# Patient Record
Sex: Female | Born: 1990 | Race: White | Hispanic: No | Smoking: Never smoker
Health system: Southern US, Community
[De-identification: ages and names within clinical notes are randomized; demographics above are authoritative.]

## PROBLEM LIST (undated history)

## (undated) DIAGNOSIS — A749 Chlamydial infection, unspecified: Secondary | ICD-10-CM

## (undated) DIAGNOSIS — K529 Noninfective gastroenteritis and colitis, unspecified: Secondary | ICD-10-CM

## (undated) DIAGNOSIS — O24419 Gestational diabetes mellitus in pregnancy, unspecified control: Secondary | ICD-10-CM

## (undated) DIAGNOSIS — R87629 Unspecified abnormal cytological findings in specimens from vagina: Secondary | ICD-10-CM

## (undated) DIAGNOSIS — R197 Diarrhea, unspecified: Secondary | ICD-10-CM

## (undated) DIAGNOSIS — IMO0002 Reserved for concepts with insufficient information to code with codable children: Secondary | ICD-10-CM

## (undated) DIAGNOSIS — Z87442 Personal history of urinary calculi: Secondary | ICD-10-CM

## (undated) DIAGNOSIS — I209 Angina pectoris, unspecified: Secondary | ICD-10-CM

## (undated) HISTORY — DX: Noninfective gastroenteritis and colitis, unspecified: K52.9

## (undated) HISTORY — DX: Unspecified abnormal cytological findings in specimens from vagina: R87.629

## (undated) HISTORY — DX: Reserved for concepts with insufficient information to code with codable children: IMO0002

## (undated) HISTORY — DX: Personal history of urinary calculi: Z87.442

## (undated) HISTORY — DX: Diarrhea, unspecified: R19.7

## (undated) HISTORY — DX: Chlamydial infection, unspecified: A74.9

---
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
## 2010-04-12 ENCOUNTER — Ambulatory Visit: Payer: Self-pay | Admitting: Gynecology

## 2010-08-04 IMAGING — US US PELV - US TRANSVAGINAL
1 series · 17 of 25 positions shown · non-contrast
Comparison: none

REASON FOR EXAM: right lower lower quadrant pain nausea, vomiting
COMMENTS:   LMP: Two weeks ago

[Series 1: us pelv - us transvaginal · 17 of 53 slices shown]
[im 1/53]
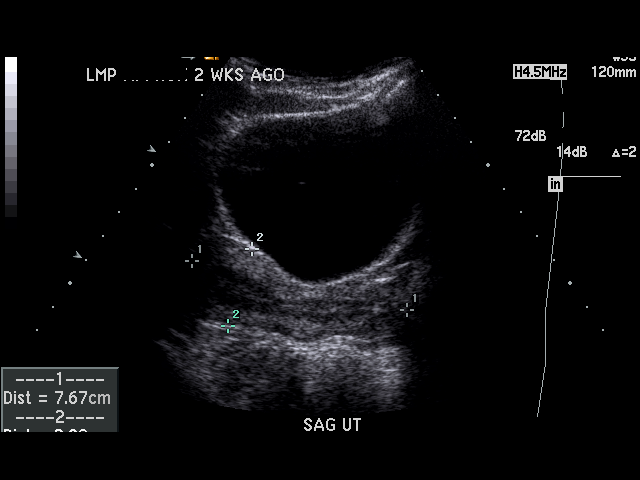
[im 5/53]
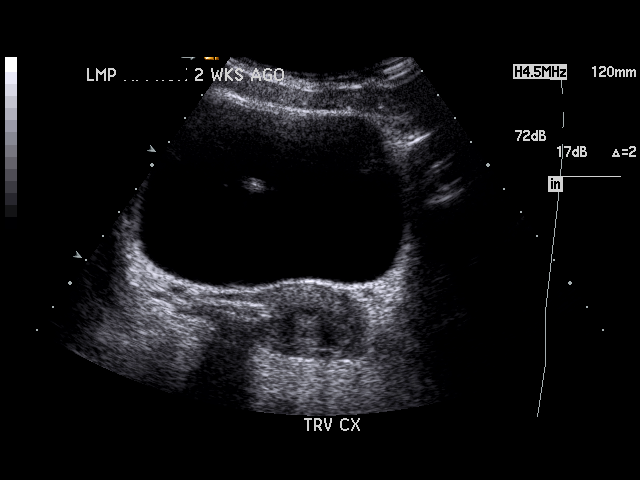
[im 7/53]
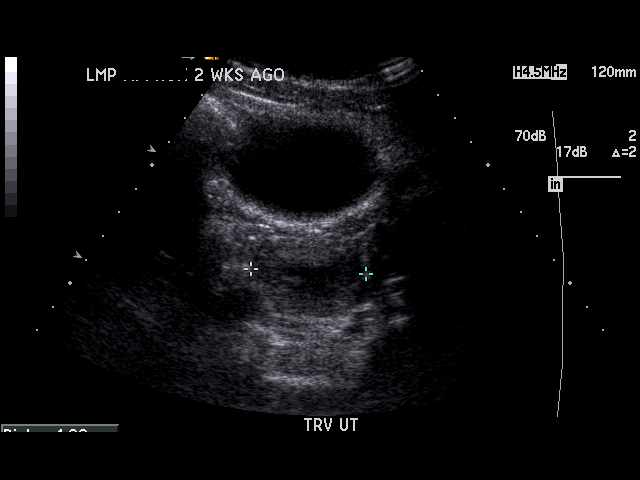
[im 11/53]
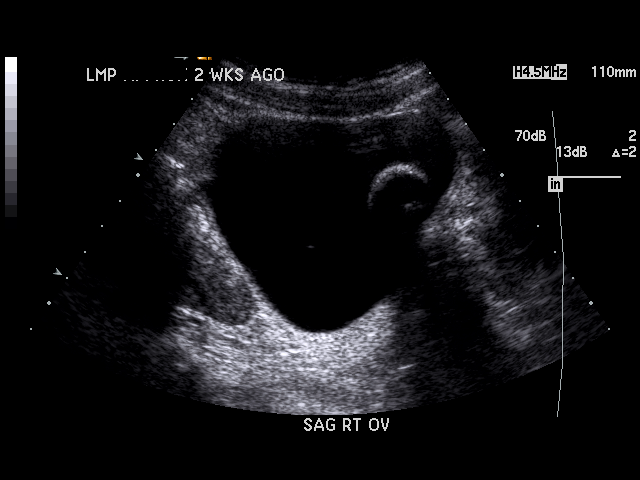
[im 14/53]
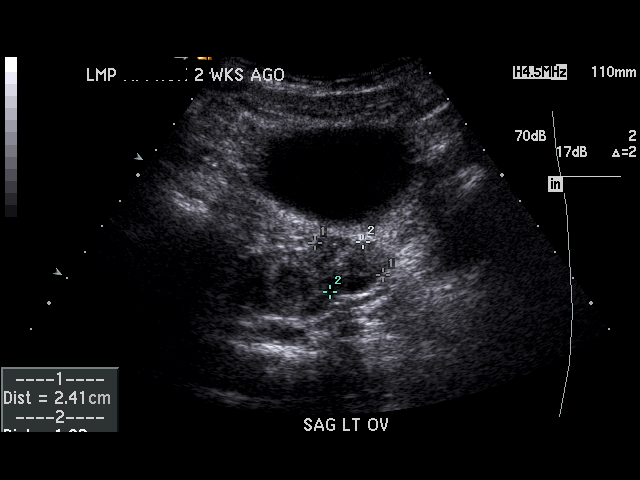
[im 18/53]
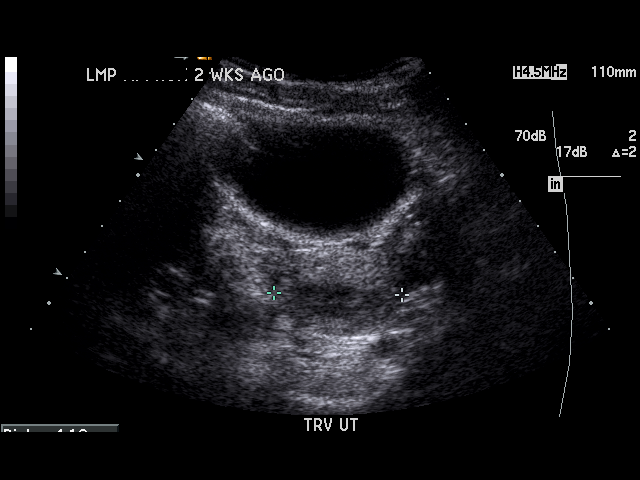
[im 20/53]
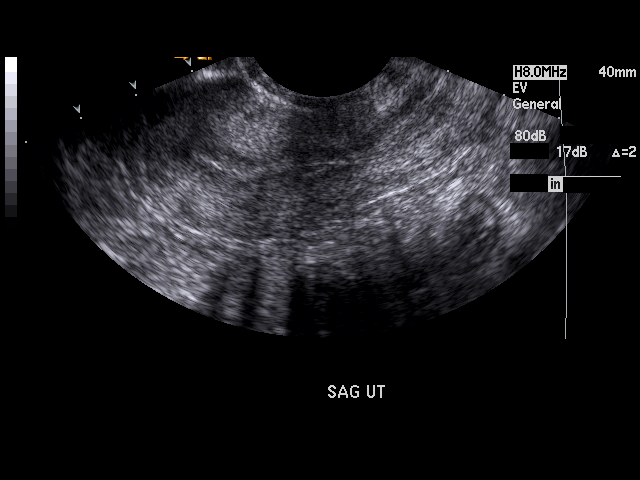
[im 24/53]
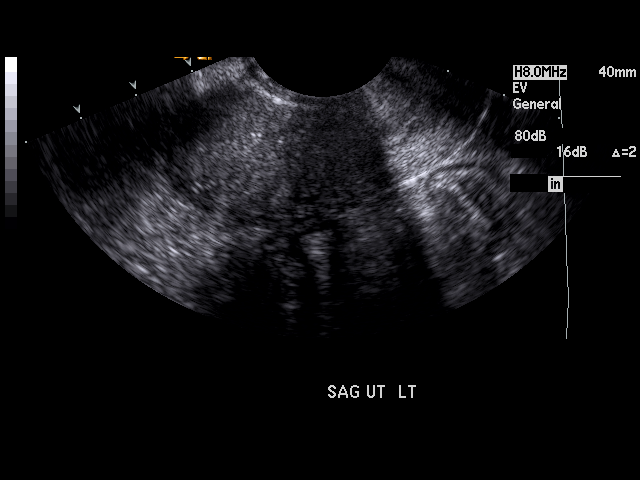
[im 27/53]
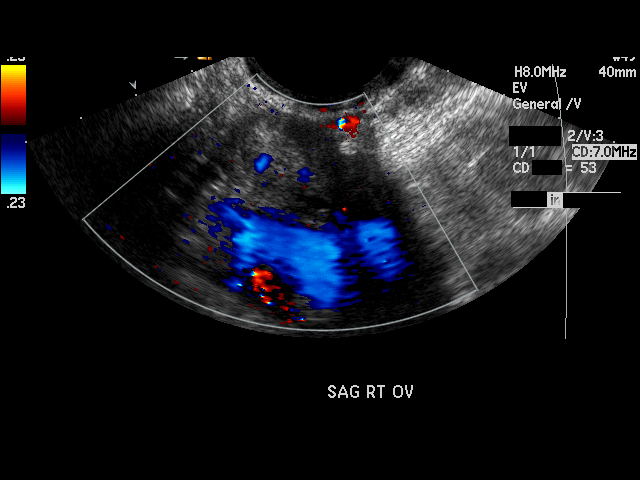
[im 29/53]
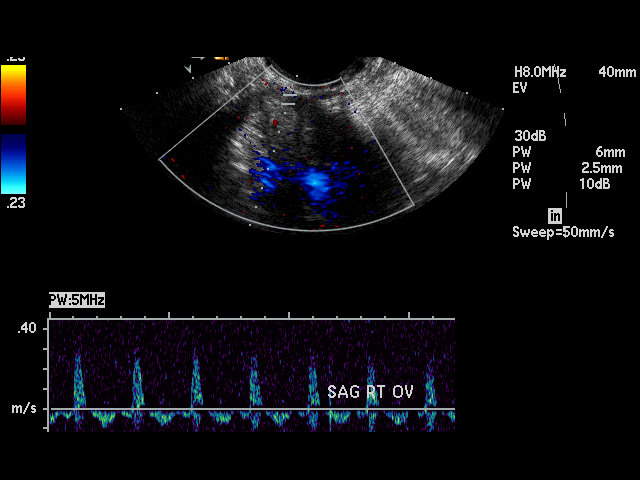
[im 33/53]
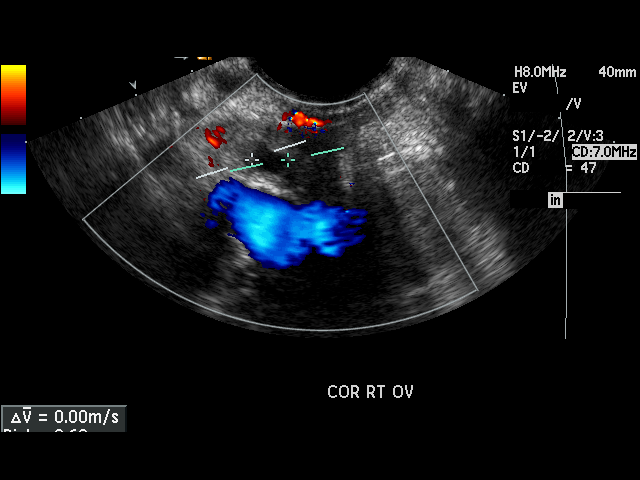
[im 35/53]
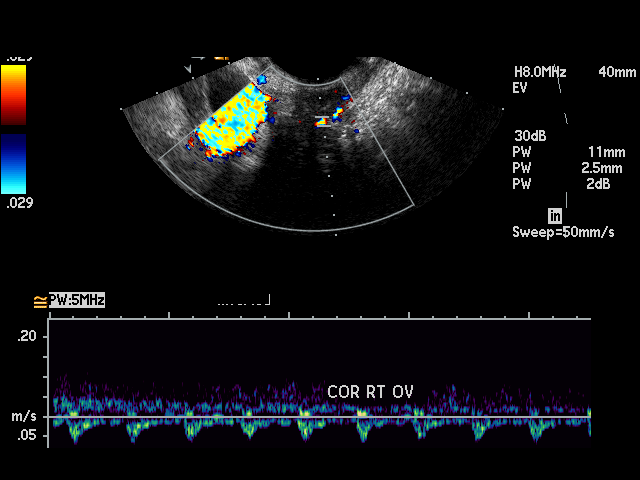
[im 40/53]
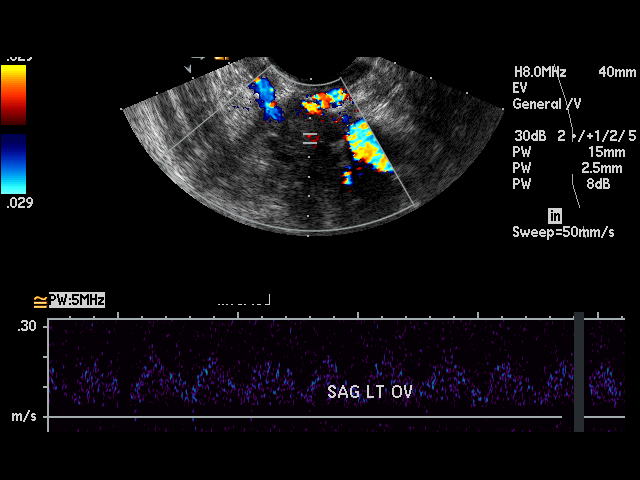
[im 42/53]
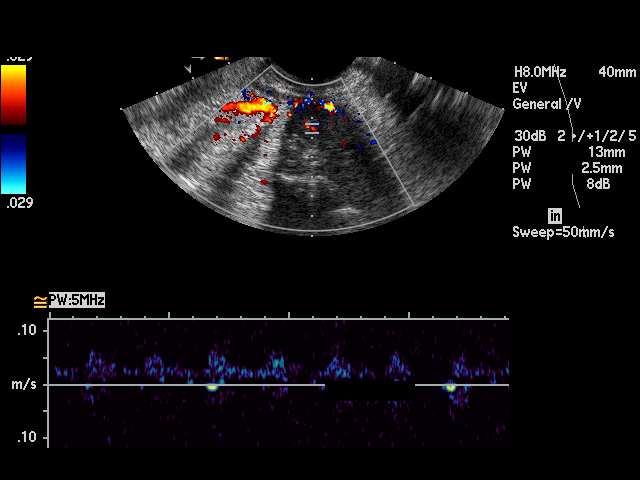
[im 46/53]
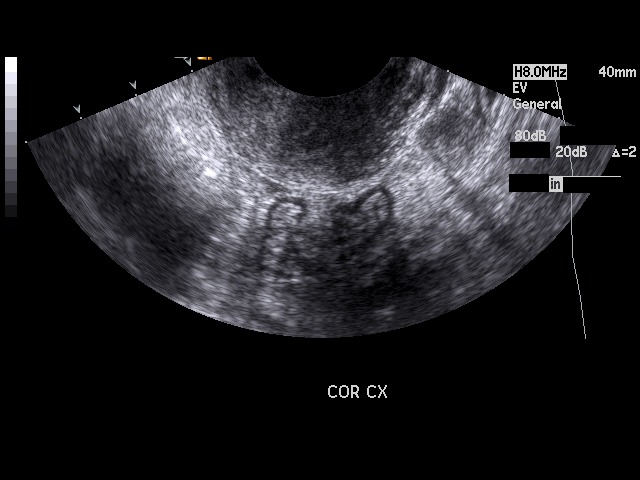
[im 48/53]
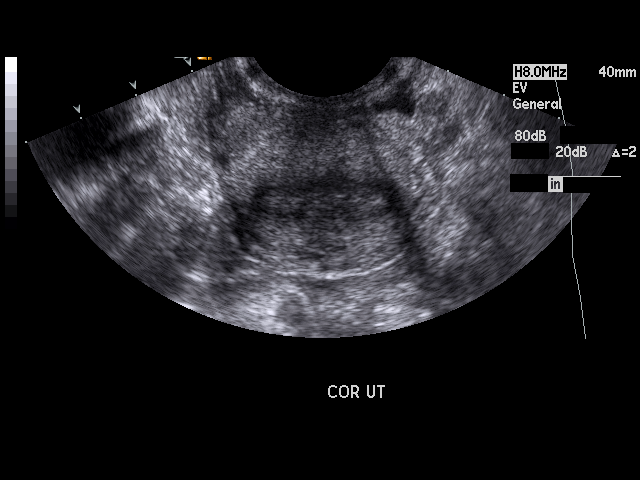
[im 53/53]
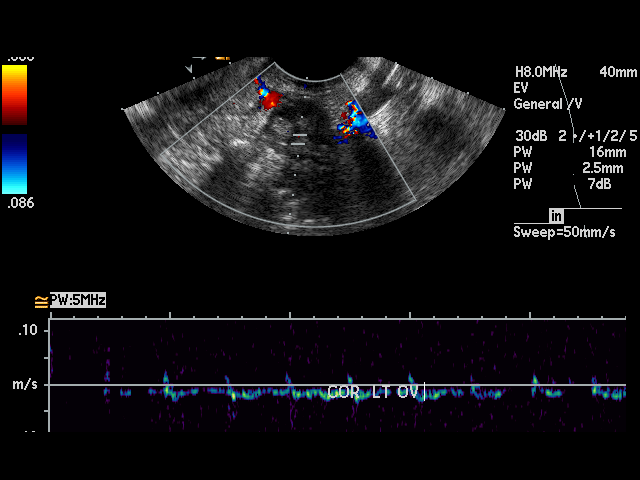

[17 of 25 positions shown; findings below may reference images not displayed]

PROCEDURE:     US  - US PELVIS EXAM W/TRANSVAGINAL  - February 12, 2009  [DATE]

RESULT:     Transabdominal and endovaginal scanning of the pelvis shows the
uterus measures 7.7 x 2.8 x 4.0 cm. The endometrial stripe thickness is 3
mm. The ovaries are normal in appearance with no mass or cyst. Normal blood
flow is seen to both ovaries. There is no free fluid or abnormal fluid
collection.
IMPRESSION: 1. Normal appearing pelvic sonogram.

## 2011-02-25 ENCOUNTER — Encounter: Payer: Self-pay | Admitting: *Deleted

## 2011-04-20 ENCOUNTER — Other Ambulatory Visit: Payer: Self-pay | Admitting: Gynecology

## 2011-04-24 ENCOUNTER — Encounter: Payer: Self-pay | Admitting: Gynecology

## 2011-08-30 ENCOUNTER — Encounter: Payer: Self-pay | Admitting: Gynecology

## 2011-09-06 ENCOUNTER — Encounter: Payer: Self-pay | Admitting: Gynecology

## 2011-09-20 ENCOUNTER — Encounter: Payer: Self-pay | Admitting: Gynecology

## 2011-09-20 ENCOUNTER — Ambulatory Visit (INDEPENDENT_AMBULATORY_CARE_PROVIDER_SITE_OTHER): Payer: BC Managed Care – PPO | Admitting: Gynecology

## 2011-09-20 VITALS — BP 110/70 | Ht 61.0 in | Wt 116.0 lb

## 2011-09-20 DIAGNOSIS — Z113 Encounter for screening for infections with a predominantly sexual mode of transmission: Secondary | ICD-10-CM

## 2011-09-20 DIAGNOSIS — Z01419 Encounter for gynecological examination (general) (routine) without abnormal findings: Secondary | ICD-10-CM

## 2011-09-20 DIAGNOSIS — Z23 Encounter for immunization: Secondary | ICD-10-CM

## 2011-09-20 LAB — CBC WITH DIFFERENTIAL/PLATELET
Basophils Absolute: 0.1 10*3/uL (ref 0.0–0.1)
Basophils Relative: 1 % (ref 0–1)
HCT: 38.8 % (ref 36.0–46.0)
Lymphocytes Relative: 34 % (ref 12–46)
Neutro Abs: 4.5 10*3/uL (ref 1.7–7.7)
Neutrophils Relative %: 53 % (ref 43–77)
Platelets: 280 10*3/uL (ref 150–400)
RDW: 13.3 % (ref 11.5–15.5)
WBC: 8.5 10*3/uL (ref 4.0–10.5)

## 2011-09-20 LAB — URINALYSIS W MICROSCOPIC + REFLEX CULTURE
Nitrite: NEGATIVE
Protein, ur: NEGATIVE mg/dL
Specific Gravity, Urine: 1.02 (ref 1.005–1.030)
Urobilinogen, UA: 0.2 mg/dL (ref 0.0–1.0)

## 2011-09-20 MED ORDER — DROSPIRENONE-ETHINYL ESTRADIOL 3-0.02 MG PO TABS: 1.0000 | ORAL_TABLET | Freq: Every day | ORAL | Status: DC

## 2011-09-20 NOTE — Progress Notes (Addendum)
Gwendolyn Rice Jul 30, 1991 [id number]        21 y.o.  for annual exam.  Has become sexually active since last time I saw her. She is on [place] doing well.  Past medical history,surgical history, medications, allergies, family history and social history were all reviewed and documented in the EPIC chart. ROS:  Was performed and pertinent positives and negatives are included in the history.  Exam: Sherrilyn Rist chaperone present Filed Vitals:   09/20/11 1411  BP: 110/70   General appearance  Normal Skin grossly normal Head/Neck normal with no cervical or supraclavicular adenopathy thyroid normal Lungs  clear Cardiac RR, without RMG Abdominal  soft, nontender, without masses, organomegaly or hernia Breasts  examined lying and sitting without masses, retractions, discharge or axillary adenopathy.  Bilateral dimpling of her nipples noted Pelvic  Ext/BUS/vagina  normal   Cervix  normal  GC Chlamydia screen done  Uterus  axial, normal size, shape and contour, midline and mobile nontender   Adnexa  Without masses or tenderness    Anus and perineum  normal    Assessment/Plan:  21 y.o. female for annual exam.   Doing well.  I reviewed slight increased thrombosis risk associated with drospirenone containing pills to include stroke heart attack DVT. She understands accepts wants to continue as she is doing well with these and I refilled her Helen Hashimoto x1 year.  SBE monthly reviewed. She is sexually active and I recommended condoms despite being on pills to help decrease STD risk. I did a GC and Chlamydia screen. I again recommended Gardasil vaccine and she declines.   I gave her literature and encouraged her to consider this.  Pap not done. We'll start at age 83 next year. We'll check baseline CBC and urinalysis. Assuming she continues well then she will see me in a year or sooner if issues.  Addendum to the above note. Patient after visit decided that she wanted to start the Gardasil vaccine and received  her first vaccination today.   Dara Lords MD, 2:29 PM 09/20/2011

## 2011-09-20 NOTE — Progress Notes (Signed)
Addended by: Alen Blew on: 09/20/2011 02:51 PM   Modules accepted: Orders

## 2011-09-20 NOTE — Patient Instructions (Signed)
Follow up in one year for annual gynecologic exam. 

## 2011-09-21 LAB — GC/CHLAMYDIA PROBE AMP, GENITAL
Chlamydia, DNA Probe: NEGATIVE
GC Probe Amp, Genital: NEGATIVE

## 2011-11-29 ENCOUNTER — Ambulatory Visit (INDEPENDENT_AMBULATORY_CARE_PROVIDER_SITE_OTHER): Payer: BC Managed Care – PPO

## 2011-11-29 DIAGNOSIS — Z23 Encounter for immunization: Secondary | ICD-10-CM

## 2011-12-23 ENCOUNTER — Telehealth: Payer: Self-pay | Admitting: *Deleted

## 2011-12-23 MED ORDER — DROSPIRENONE-ETHINYL ESTRADIOL 3-0.02 MG PO TABS: 1.0000 | ORAL_TABLET | Freq: Every day | ORAL | Status: DC

## 2011-12-23 NOTE — Telephone Encounter (Signed)
Pt mother called requesting 90 day supply of pt Gianvi to [facility] pharmacy, rx sent.

## 2012-01-01 ENCOUNTER — Telehealth: Payer: Self-pay | Admitting: *Deleted

## 2012-01-01 MED ORDER — GIANVI 3-0.02 MG PO TABS: 1.0000 | ORAL_TABLET | Freq: Every day | ORAL | Status: DC

## 2012-01-01 NOTE — Telephone Encounter (Signed)
Pt called wanting brand only gianvi, rx sent to pharmacy.

## 2012-04-29 ENCOUNTER — Telehealth: Payer: Self-pay | Admitting: *Deleted

## 2012-04-29 NOTE — Telephone Encounter (Signed)
Misty Stanley pt mother informed pt is overdue for gardasil shot. 3rd shot due in august. 1st shot given in feb 2013

## 2012-07-22 ENCOUNTER — Ambulatory Visit (INDEPENDENT_AMBULATORY_CARE_PROVIDER_SITE_OTHER): Payer: BC Managed Care – PPO | Admitting: Gynecology

## 2012-07-22 DIAGNOSIS — Z23 Encounter for immunization: Secondary | ICD-10-CM

## 2012-11-09 ENCOUNTER — Ambulatory Visit (INDEPENDENT_AMBULATORY_CARE_PROVIDER_SITE_OTHER): Payer: BC Managed Care – PPO | Admitting: Gynecology

## 2012-11-09 ENCOUNTER — Encounter: Payer: Self-pay | Admitting: Gynecology

## 2012-11-09 ENCOUNTER — Other Ambulatory Visit (HOSPITAL_COMMUNITY)
Admission: RE | Admit: 2012-11-09 | Discharge: 2012-11-09 | Disposition: A | Discharge status: Home / Self Care | Payer: BC Managed Care – PPO | Source: Ambulatory Visit | Attending: Gynecology | Admitting: Gynecology

## 2012-11-09 VITALS — BP 110/66 | Ht 61.0 in | Wt 136.0 lb

## 2012-11-09 DIAGNOSIS — Z01419 Encounter for gynecological examination (general) (routine) without abnormal findings: Secondary | ICD-10-CM | POA: Insufficient documentation

## 2012-11-09 DIAGNOSIS — Z113 Encounter for screening for infections with a predominantly sexual mode of transmission: Secondary | ICD-10-CM

## 2012-11-09 MED ORDER — GIANVI 3-0.02 MG PO TABS: 1.0000 | ORAL_TABLET | Freq: Every day | ORAL | Status: DC

## 2012-11-09 NOTE — Patient Instructions (Signed)
Continue on the birth control pills Follow up in one year for annual exam

## 2012-11-09 NOTE — Progress Notes (Signed)
Kaizlee Kinnaird 02-12-1991 [id number]        22 y.o.  G0P0 for annual exam.  Doing well without complaints.  Past medical history,surgical history, medications, allergies, family history and social history were all reviewed and documented in the EPIC chart. ROS:  Was performed and pertinent positives and negatives are included in the history.  Exam: Kim assistant Filed Vitals:   11/09/12 1548  BP: 110/66  Height: 5\' 1"  (1.549 m)  Weight: 136 lb (61.689 kg)   General appearance  Normal Skin grossly normal Head/Neck normal with no cervical or supraclavicular adenopathy thyroid normal Lungs  clear Cardiac RR, without RMG Abdominal  soft, nontender, without masses, organomegaly or hernia Breasts  examined lying and sitting without masses, retractions, discharge or axillary adenopathy. Pelvic  Ext/BUS/vagina  normal   Cervix  normal with Pap, GC/Chlamydia done  Uterus  anteverted, normal size, shape and contour, midline and mobile nontender   Adnexa  Without masses or tenderness    Anus and perineum  normal      Assessment/Plan:  22 y.o. G0P0 female for annual exam.   1. On Gianvi BCPs doing well. Good relief of her dysmenorrhea with irregular menses. Rediscussed risks to include thrombosis with stroke heart attack DVT. Possible slight increased risk with drospironone containing pills.  Understood and accepted and I refilled her times a year. 2. STD screening. Patient is sectioning active and I did a GC and Chlamydia screen. Need to use condoms regardless to help decrease STD risks reviewed. 3. Pap smear. First Pap smear done today as she turned 21. Plan every 3 year interval assuming normal. 4. Breast health. SBE monthly reviewed. 5. Gardasil series received 6. Health maintenance. Check UA today. Had normal CBC last year. Light regular menses we'll not repeat this year.    Dara Lords MD, 4:08 PM 11/09/2012

## 2012-11-09 NOTE — Addendum Note (Signed)
Addended by: Dayna Barker on: 11/09/2012 04:21 PM   Modules accepted: Orders

## 2012-11-10 ENCOUNTER — Encounter: Payer: Self-pay | Admitting: Gynecology

## 2012-11-10 LAB — URINALYSIS W MICROSCOPIC + REFLEX CULTURE
Casts: NONE SEEN
Glucose, UA: NEGATIVE mg/dL
Hgb urine dipstick: NEGATIVE
Ketones, ur: NEGATIVE mg/dL
Protein, ur: NEGATIVE mg/dL
pH: 6 (ref 5.0–8.0)

## 2012-11-10 LAB — GC/CHLAMYDIA PROBE AMP
CT Probe RNA: NEGATIVE
GC Probe RNA: NEGATIVE

## 2012-11-13 ENCOUNTER — Telehealth: Payer: Self-pay | Admitting: Gynecology

## 2012-11-13 NOTE — Telephone Encounter (Signed)
Tell patient her Pap smear showed high-grade dysplasia and she needs to schedule a colposcopy appointment. Very important she follows up for this because it could turn into cervical cancer if we do not evaluate it.

## 2012-11-16 NOTE — Telephone Encounter (Signed)
Left message to call home phone.

## 2012-11-17 DIAGNOSIS — IMO0002 Reserved for concepts with insufficient information to code with codable children: Secondary | ICD-10-CM | POA: Insufficient documentation

## 2012-11-17 NOTE — Telephone Encounter (Signed)
I spoke with patient's mom and explained results in detail.  Explained C&B.  I told her to stress to patient the importance of follow-up as this could become cancer if not managed.  She went ahead and scheduled appointment for the patient. They will call me back if any questions arise.

## 2012-12-02 ENCOUNTER — Ambulatory Visit (INDEPENDENT_AMBULATORY_CARE_PROVIDER_SITE_OTHER): Payer: BC Managed Care – PPO | Admitting: Gynecology

## 2012-12-02 ENCOUNTER — Encounter: Payer: Self-pay | Admitting: Gynecology

## 2012-12-02 DIAGNOSIS — R87613 High grade squamous intraepithelial lesion on cytologic smear of cervix (HGSIL): Secondary | ICD-10-CM

## 2012-12-02 NOTE — Progress Notes (Signed)
Patient ID: Gwendolyn Rice, female   DOB: 11-23-1990, 22 y.o.   MRN: [id number] Patient presents for colposcopy with her first Pap smear showing HGSIL.  Exam with Selena Batten assistant External BUS vagina normal. Cervix grossly normal. Colposcopy after acetic acid cleanse shows normal ectropion with 3 small areas at 12:00 transformation zone leukoplakic with acetowhite thickening. All 3 areas were biopsied off and sent together to pathology. An endocervical separate sample was taken with the brush. Physical Exam  Genitourinary:     assessment and plan: First Pap smear shows HGSIL. Colposcopy shows leukoplakic/acetowhite change at 12:00 transformation zone consistent with high-grade change. All areas were biopsied off as they were small. Separate endocervical sample taken with brush. Patient will followup for results. Had a long discussion with her mother about dysplasia, high grade/low grade, progression/regression and HPV relationship. Patient will follow up the biopsy results and we'll go from there. Potential for continued observation with the absolute need for followup with the realistic potential that this could progress to cervical cancer reviewed versus treatment such as LEEP discussed.

## 2012-12-02 NOTE — Patient Instructions (Signed)
Office will call you with the biopsy results 

## 2012-12-07 ENCOUNTER — Encounter: Payer: Self-pay | Admitting: Gynecology

## 2013-03-05 ENCOUNTER — Other Ambulatory Visit: Payer: Self-pay | Admitting: Gynecology

## 2013-05-24 ENCOUNTER — Ambulatory Visit (INDEPENDENT_AMBULATORY_CARE_PROVIDER_SITE_OTHER): Payer: BC Managed Care – PPO | Admitting: Gynecology

## 2013-05-24 ENCOUNTER — Encounter: Payer: Self-pay | Admitting: Gynecology

## 2013-05-24 DIAGNOSIS — Z87442 Personal history of urinary calculi: Secondary | ICD-10-CM | POA: Insufficient documentation

## 2013-05-24 DIAGNOSIS — R87613 High grade squamous intraepithelial lesion on cytologic smear of cervix (HGSIL): Secondary | ICD-10-CM

## 2013-05-24 NOTE — Patient Instructions (Addendum)
Office will call you with biopsy results. Use an over-the-counter ear wax removal kit. Follow up if ear pain continues.

## 2013-05-24 NOTE — Progress Notes (Signed)
Patient ID: Gwendolyn Rice, female   DOB: [birth year], 22 y.o.   MRN: [id number]  Patient presents for colposcopy. History of first Pap smear 10/2012 showing HGSIL. Colposcopic biopsies 11/2012 showed "1. Cervix, biopsy, 12:00 - HIGH GRADE SQUAMOUS INTRAEPITHELIAL LESION, CIN-II (MODERATE DYSPLASIA), SEE COMMENT 2. Endocervix, curettage, endocervical sampling - MINUTE FRAGMENTS OF BENIGN ENDOCERVICAL GLANDULAR EPITHELIUM, SEE COMMENT".   Options for management were reviewed to include LEEP versus observation at this point we plan on observation.  The patient was to return in 6 months for repeat colposcopy.  Patient also complaining of right ear pain over the last several days. No fever chills sore throat cough sputum or other URI symptoms.  Exam with Selena Batten assistant External BUS vagina normal. Cervix normal. Uterus normal size midline mobile nontender. Adnexa without masses or tenderness.  Colposcopy after acetic acid cleanse adequate with large ectropion and a patch of acetowhite change 12:00 transformation zone. Biopsy at 12:00 and random 6:00 transformation zone/ectropion biopsy taken. Followup ECC performed. Patient tolerated well.  Bilateral external ear otoscopic exam performed and both ear canals are occluded with wax  Physical Exam  Genitourinary:     Assessment and plan:  1. 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 Acetowhite change at the same area. Represent a biopsy taken as well as a random 6:00 transformation zone/ectropion biopsy. ECC also performed. Patient will followup results. Possibilities discussed to include continued observation versus proceeding with LEEP. We'll rediscuss options after biopsy results. 2. Right ear pain. Is ear canals occluded with wax. No overt evidence of otitis externa. Recommended OTC ear wax removal system. Pain persists will represent.

## 2013-05-26 ENCOUNTER — Telehealth: Payer: Self-pay | Admitting: *Deleted

## 2013-05-26 NOTE — Telephone Encounter (Signed)
Pt informed with the below note. 

## 2013-05-26 NOTE — Telephone Encounter (Signed)
Pt had C & B on 05/24/13, pt called c/o spotting last couple of days. I told pt not abnormal to having spotting after procedure, cycle not due until another 5-6 days. I told pt to watch for now if increase bleeding or if bleeding should continue to follow up, pt asked me to make you aware of this. Please advise

## 2013-05-26 NOTE — Telephone Encounter (Signed)
Spotting not unusual after colposcopy and biopsy. Could occur for one to 2 weeks.

## 2013-06-03 ENCOUNTER — Ambulatory Visit (INDEPENDENT_AMBULATORY_CARE_PROVIDER_SITE_OTHER): Payer: BC Managed Care – PPO | Admitting: Gynecology

## 2013-06-03 ENCOUNTER — Encounter: Payer: Self-pay | Admitting: Gynecology

## 2013-06-03 DIAGNOSIS — R87613 High grade squamous intraepithelial lesion on cytologic smear of cervix (HGSIL): Secondary | ICD-10-CM

## 2013-06-03 NOTE — Patient Instructions (Signed)
Office will call you to arrange surgery. 

## 2013-06-03 NOTE — Progress Notes (Signed)
Patient presents with her mother to discuss her colposcopy biopsy results. Patient has history of first Pap smear 10/2012 showing HGSIL. Colposcopic biopsies 11/2012 showed : 1. Cervix, biopsy, 12:00 - HIGH GRADE SQUAMOUS INTRAEPITHELIAL LESION, CIN-II (MODERATE DYSPLASIA), SEE COMMENT 2. Endocervix, curettage, endocervical sampling - MINUTE FRAGMENTS OF BENIGN ENDOCERVICAL GLANDULAR EPITHELIUM  We elected for expectant management and she returned for colposcopy 05/2013 where acetowhite change at 12:00 transformations and was noted. Biopsies at 12:00 taken. Random 6:00 biopsy taken and ECC performed with pathology showing:  1. 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 2. 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 3. Endocervix, curettage - DETACHED FRAGMENTS OF DYSPLASTIC SQUAMOUS EPITHELIUM, CONSISTENT WITH HIGH GRADE SQUAMOUS INTRAEPITHELIAL LESION, CIN-III (SEVERE DYSPLASIA). - BENIGN ENDOCERVICAL GLANDS.  I reviewed the situation with the patient and her mother. 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 Given the total picture I think treatment is warranted. I reviewed with her we are not eradicating the virus and she is at risk for persistent or recurrent dysplasia in the future. My suggestion is to proceed with LEEP and I reviewed the procedure with her. I discussed the risks to include bleeding, infection, damage to internal and surrounding tissues to include vagina, bladder, rectum. Various pathology possibilities reviewed to include dysplasia found with clear margins, dysplasia found with involved margins which could possibly require further and future treatment and no pathology found. I also reviewed with her that there appears to be a slight increased risk of pregnancy-related complications to include possible  infertility, SAB, pre-term delivery as well as peri-viable delivery associated with cervical treatments such as laser LEEP and cone. Given that the patient is extremely anxious and was marginally tolerating the colposcopy appointment I think it would be prudent to have the LEEP under at least IV sedation and the patient and her mother agrees. We will schedule at her convenience but I did suggest that we need to move toward the next one to 2 months and not delay this any extended period of time.

## 2013-07-05 ENCOUNTER — Telehealth: Payer: Self-pay | Admitting: *Deleted

## 2013-07-05 NOTE — Telephone Encounter (Signed)
Pt mother called stating pt has new insurance and will need her birth control pills sent to new pharmacy. I left message on pt cell ([phone])  to call me

## 2013-07-06 MED ORDER — GIANVI 3-0.02 MG PO TABS: ORAL_TABLET | ORAL | Status: DC

## 2013-07-06 NOTE — Telephone Encounter (Signed)
Pt would like rx sent to [facility]

## 2013-07-12 ENCOUNTER — Telehealth: Payer: Self-pay | Admitting: *Deleted

## 2013-07-12 ENCOUNTER — Encounter (HOSPITAL_COMMUNITY): Payer: Self-pay | Admitting: Pharmacist

## 2013-07-12 MED ORDER — DROSPIRENONE-ETHINYL ESTRADIOL 3-0.02 MG PO TABS: 1.0000 | ORAL_TABLET | Freq: Every day | ORAL | Status: DC

## 2013-07-12 NOTE — Telephone Encounter (Signed)
rx sent

## 2013-07-12 NOTE — Telephone Encounter (Signed)
Okay for [employer]

## 2013-07-12 NOTE — Telephone Encounter (Signed)
Pt birth control pills have increased at $ 50 per pack for Gianvi, pt can get loryna pills for free, pt would like to know if switch can be done? Please advise

## 2013-07-14 ENCOUNTER — Other Ambulatory Visit: Payer: Self-pay

## 2013-07-21 ENCOUNTER — Encounter: Payer: Self-pay | Admitting: Gynecology

## 2013-07-21 ENCOUNTER — Ambulatory Visit (INDEPENDENT_AMBULATORY_CARE_PROVIDER_SITE_OTHER): Payer: Managed Care, Other (non HMO) | Admitting: Gynecology

## 2013-07-21 DIAGNOSIS — R87613 High grade squamous intraepithelial lesion on cytologic smear of cervix (HGSIL): Secondary | ICD-10-CM

## 2013-07-21 NOTE — Progress Notes (Signed)
Arcola Backstrom 1990/10/26 [id number]   Preoperative consult.  Chief complaint: HGSIL  History of present illness: 22 y.o. G0P0 with history of first Pap smear 10/2012 showing HGSIL. Colposcopic biopsies 11/2012 showed : 1. Cervix, biopsy, 12:00 - HIGH GRADE SQUAMOUS INTRAEPITHELIAL LESION, CIN-II (MODERATE DYSPLASIA), SEE COMMENT 2. Endocervix, curettage, endocervical sampling - MINUTE FRAGMENTS OF BENIGN ENDOCERVICAL GLANDULAR EPITHELIUM  We elected for expectant management and she returned for colposcopy 05/2013 where acetowhite change at 12:00 transformations was noted. Biopsies at 12:00 taken. Random 6:00 biopsy taken and ECC performed with pathology showing:  1. 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 2. 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 3. Endocervix, curettage - DETACHED FRAGMENTS OF DYSPLASTIC SQUAMOUS EPITHELIUM, CONSISTENT WITH HIGH GRADE SQUAMOUS INTRAEPITHELIAL LESION, CIN-III (SEVERE DYSPLASIA). - BENIGN ENDOCERVICAL GLANDS.   Patient is admitted for LEEP cone under anesthesia due to persistent/worsening cervical dysplasia and her intolerance to office pelvic exams and procedures.  Past medical history,surgical history, medications, allergies, family history and social history were all reviewed and documented in the EPIC chart. ROS:  Was performed and pertinent positives and negatives are included in the history of present illness.  Exam:  Kim assistant General: well developed, well nourished female, no acute distress HEENT: normal  Lungs: clear to auscultation without wheezing, rales or rhonchi  Cardiac: regular rate without rubs, murmurs or gallops  Abdomen: soft, nontender without masses, guarding, rebound, organomegaly  Pelvic: external bus vagina: normal   Cervix: grossly normal  Uterus: normal size,  midline and mobile, nontender  Adnexa: without masses or tenderness      Assessment/Plan:   I reviewed the situation with the patient and her mother. 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 Given the total picture I think treatment is warranted. I reviewed with her we are not eradicating the virus and she is at risk for persistent or recurrent dysplasia in the future. My suggestion is to proceed with LEEP and I reviewed the procedure with her. I discussed the risks to include bleeding, infection, damage to internal and surrounding tissues to include vagina, bladder, rectum. Various pathology possibilities reviewed to include dysplasia found with clear margins, dysplasia found with involved margins which could possibly require further and future treatment and no pathology found. I also reviewed with her that there appears to be a slight increased risk of pregnancy-related complications to include possible infertility, SAB, pre-term delivery as well as peri-viable delivery associated with cervical treatments such as laser LEEP and cone. Given that the patient is extremely anxious and was marginally tolerating the colposcopy appointment I think it would be prudent to have the LEEP under at least IV sedation and the patient and her mother agrees. The patient's questions were answered and she is ready to proceed with surgery.   Note: This document was prepared with digital dictation and possible smart phrase technology. Any transcriptional errors that result from this process are unintentional.  Anastasio Auerbach MD, 4:40 PM 07/21/2013

## 2013-07-21 NOTE — H&P (Signed)
  Gwendolyn Rice [birth year] [id number]   History and Physical  Chief complaint: HGSIL  History of present illness: 22 y.o. G0P0 with history of first Pap smear 10/2012 showing HGSIL. Colposcopic biopsies 11/2012 showed : 1. Cervix, biopsy, 12:00 - HIGH GRADE SQUAMOUS INTRAEPITHELIAL LESION, CIN-II (MODERATE DYSPLASIA), SEE COMMENT 2. Endocervix, curettage, endocervical sampling - MINUTE FRAGMENTS OF BENIGN ENDOCERVICAL GLANDULAR EPITHELIUM  We elected for expectant management and she returned for colposcopy 05/2013 where acetowhite change at 12:00 transformations was noted. Biopsies at 12:00 taken. Random 6:00 biopsy taken and ECC performed with pathology showing:  1. 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 2. 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 3. Endocervix, curettage - DETACHED FRAGMENTS OF DYSPLASTIC SQUAMOUS EPITHELIUM, CONSISTENT WITH HIGH GRADE SQUAMOUS INTRAEPITHELIAL LESION, CIN-III (SEVERE DYSPLASIA). - BENIGN ENDOCERVICAL GLANDS.   Patient is admitted for LEEP cone under anesthesia due to persistent/worsening cervical dysplasia and her intolerance to office pelvic exams and procedures.  Past medical history,surgical history, medications, allergies, family history and social history were all reviewed and documented in the EPIC chart. ROS:  Was performed and pertinent positives and negatives are included in the history of present illness.  Exam:  Kim assistant General: well developed, well nourished female, no acute distress HEENT: normal  Lungs: clear to auscultation without wheezing, rales or rhonchi  Cardiac: regular rate without rubs, murmurs or gallops  Abdomen: soft, nontender without masses, guarding, rebound, organomegaly  Pelvic: external bus vagina: normal   Cervix: grossly normal  Uterus: normal size,  midline and mobile, nontender  Adnexa: without masses or tenderness      Assessment/Plan:   I reviewed the situation with the patient and her mother. 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 Given the total picture I think treatment is warranted. I reviewed with her we are not eradicating the virus and she is at risk for persistent or recurrent dysplasia in the future. My suggestion is to proceed with LEEP and I reviewed the procedure with her. I discussed the risks to include bleeding, infection, damage to internal and surrounding tissues to include vagina, bladder, rectum. Various pathology possibilities reviewed to include dysplasia found with clear margins, dysplasia found with involved margins which could possibly require further and future treatment and no pathology found. I also reviewed with her that there appears to be a slight increased risk of pregnancy-related complications to include possible infertility, SAB, pre-term delivery as well as peri-viable delivery associated with cervical treatments such as laser LEEP and cone. Given that the patient is extremely anxious and was marginally tolerating the colposcopy appointment I think it would be prudent to have the LEEP under at least IV sedation and the patient and her mother agrees. The patient's questions were answered and she is ready to proceed with surgery.    Note: This document was prepared with digital dictation and possible smart phrase technology. Any transcriptional errors that result from this process are unintentional.  Dara Lords MD, 4:57 PM 07/21/2013  @LOGO @

## 2013-07-21 NOTE — Patient Instructions (Signed)
Followup for surgery as scheduled. 

## 2013-07-22 ENCOUNTER — Ambulatory Visit: Payer: Managed Care, Other (non HMO) | Admitting: Gynecology

## 2013-07-23 ENCOUNTER — Encounter (HOSPITAL_COMMUNITY): Payer: Managed Care, Other (non HMO) | Admitting: Anesthesiology

## 2013-07-23 ENCOUNTER — Encounter (HOSPITAL_COMMUNITY): Payer: Self-pay | Admitting: *Deleted

## 2013-07-23 ENCOUNTER — Ambulatory Visit (HOSPITAL_COMMUNITY)
Admission: RE | Admit: 2013-07-23 | Discharge: 2013-07-23 | Disposition: A | Discharge status: Home / Self Care | Payer: Managed Care, Other (non HMO) | Source: Ambulatory Visit | Attending: Gynecology | Admitting: Gynecology

## 2013-07-23 ENCOUNTER — Ambulatory Visit (HOSPITAL_COMMUNITY): Payer: Managed Care, Other (non HMO) | Admitting: Anesthesiology

## 2013-07-23 ENCOUNTER — Encounter (HOSPITAL_COMMUNITY)
Admission: RE | Disposition: A | Discharge status: Home / Self Care | Payer: Self-pay | Source: Ambulatory Visit | Attending: Gynecology

## 2013-07-23 DIAGNOSIS — R87613 High grade squamous intraepithelial lesion on cytologic smear of cervix (HGSIL): Secondary | ICD-10-CM

## 2013-07-23 DIAGNOSIS — D069 Carcinoma in situ of cervix, unspecified: Secondary | ICD-10-CM | POA: Insufficient documentation

## 2013-07-23 HISTORY — DX: Angina pectoris, unspecified: I20.9

## 2013-07-23 HISTORY — PX: LEEP: SHX91

## 2013-07-23 LAB — HCG, SERUM, QUALITATIVE: Preg, Serum: NEGATIVE

## 2013-07-23 SURGERY — LEEP (LOOP ELECTROSURGICAL EXCISION PROCEDURE)
Anesthesia: Monitor Anesthesia Care

## 2013-07-23 MED ORDER — LIDOCAINE HCL (CARDIAC) 20 MG/ML IV SOLN
INTRAVENOUS | Status: AC
  Filled 2013-07-23: qty 5

## 2013-07-23 MED ORDER — DEXTROSE 5 % IV SOLN: 2.0000 g | INTRAVENOUS | Status: DC

## 2013-07-23 MED ORDER — FENTANYL CITRATE 0.05 MG/ML IJ SOLN
INTRAMUSCULAR | Status: AC
  Filled 2013-07-23: qty 5

## 2013-07-23 MED ORDER — IODINE STRONG (LUGOLS) 5 % PO SOLN
ORAL | Status: AC
  Filled 2013-07-23: qty 1

## 2013-07-23 MED ORDER — FENTANYL CITRATE 0.05 MG/ML IJ SOLN: 25.0000 ug | INTRAMUSCULAR | Status: DC | PRN

## 2013-07-23 MED ORDER — LIDOCAINE-EPINEPHRINE (PF) 2 %-1:200000 IJ SOLN
INTRAMUSCULAR | Status: DC | PRN
  Administered 2013-07-23: 4 mL via INTRADERMAL

## 2013-07-23 MED ORDER — ONDANSETRON HCL 4 MG/2ML IJ SOLN
INTRAMUSCULAR | Status: AC
  Filled 2013-07-23: qty 2

## 2013-07-23 MED ORDER — MIDAZOLAM HCL 2 MG/2ML IJ SOLN
INTRAMUSCULAR | Status: AC
  Filled 2013-07-23: qty 2

## 2013-07-23 MED ORDER — FERRIC SUBSULFATE SOLN
Status: DC | PRN
  Administered 2013-07-23: 1

## 2013-07-23 MED ORDER — ONDANSETRON HCL 4 MG/2ML IJ SOLN
INTRAMUSCULAR | Status: DC | PRN
  Administered 2013-07-23: 4 mg via INTRAVENOUS

## 2013-07-23 MED ORDER — LIDOCAINE HCL (CARDIAC) 20 MG/ML IV SOLN
INTRAVENOUS | Status: DC | PRN
  Administered 2013-07-23: 50 mg via INTRAVENOUS

## 2013-07-23 MED ORDER — PROPOFOL 10 MG/ML IV EMUL
INTRAVENOUS | Status: AC
  Filled 2013-07-23: qty 20

## 2013-07-23 MED ORDER — LACTATED RINGERS IV SOLN
INTRAVENOUS | Status: DC
  Administered 2013-07-23: 50 mL/h via INTRAVENOUS

## 2013-07-23 MED ORDER — MIDAZOLAM HCL 2 MG/2ML IJ SOLN
INTRAMUSCULAR | Status: DC | PRN
  Administered 2013-07-23: 2 mg via INTRAVENOUS

## 2013-07-23 MED ORDER — KETOROLAC TROMETHAMINE 30 MG/ML IJ SOLN
INTRAMUSCULAR | Status: AC
  Filled 2013-07-23: qty 1

## 2013-07-23 MED ORDER — ACETIC ACID 4% SOLUTION
Status: DC | PRN
  Administered 2013-07-23: 1 via TOPICAL

## 2013-07-23 MED ORDER — IODINE STRONG (LUGOLS) 5 % PO SOLN
ORAL | Status: DC | PRN
  Administered 2013-07-23: 0.1 mL

## 2013-07-23 MED ORDER — FERRIC SUBSULFATE 259 MG/GM EX SOLN
CUTANEOUS | Status: AC
  Filled 2013-07-23: qty 8

## 2013-07-23 MED ORDER — PROPOFOL 10 MG/ML IV BOLUS
INTRAVENOUS | Status: DC | PRN
  Administered 2013-07-23: 200 mg via INTRAVENOUS

## 2013-07-23 MED ORDER — FENTANYL CITRATE 0.05 MG/ML IJ SOLN
INTRAMUSCULAR | Status: DC | PRN
  Administered 2013-07-23: 50 ug via INTRAVENOUS

## 2013-07-23 MED ORDER — ACETIC ACID 5 % SOLN
Status: AC
  Filled 2013-07-23: qty 500

## 2013-07-23 MED ORDER — KETOROLAC TROMETHAMINE 30 MG/ML IJ SOLN
INTRAMUSCULAR | Status: DC | PRN
  Administered 2013-07-23: 30 mg via INTRAVENOUS

## 2013-07-23 MED ORDER — LIDOCAINE-EPINEPHRINE (PF) 2 %-1:200000 IJ SOLN
INTRAMUSCULAR | Status: AC
  Filled 2013-07-23: qty 20

## 2013-07-23 MED ORDER — IBUPROFEN 800 MG PO TABS: 800.0000 mg | ORAL_TABLET | Freq: Three times a day (TID) | ORAL | Status: DC | PRN

## 2013-07-23 SURGICAL SUPPLY — 36 items
APPLICATOR COTTON TIP 6IN STRL (MISCELLANEOUS) ×2 IMPLANT
CLOTH BEACON ORANGE TIMEOUT ST (SAFETY) ×2 IMPLANT
COUNTER NEEDLE 1200 MAGNETIC (NEEDLE) IMPLANT
DRESSING TELFA 8X3 (GAUZE/BANDAGES/DRESSINGS) ×2 IMPLANT
ELECT BALL LEEP 5MM RED (ELECTRODE) IMPLANT
ELECT LOOP 20X12 DISP (CUTTING LOOP) ×2
ELECT LOOP LEEP RND 10X10 YLW (CUTTING LOOP)
ELECT LOOP LEEP RND 15X12 GRN (CUTTING LOOP)
ELECT LOOP LEEP RND 20X12 WHT (CUTTING LOOP)
ELECT REM PT RETURN 9FT ADLT (ELECTROSURGICAL) ×2
ELECTRODE LOOP 20X12 DISP (CUTTING LOOP) ×1 IMPLANT
ELECTRODE LOOP LP RND 10X10YLW (CUTTING LOOP) IMPLANT
ELECTRODE LOOP LP RND 15X12GRN (CUTTING LOOP) IMPLANT
ELECTRODE LOOP LP RND 20X12WHT (CUTTING LOOP) IMPLANT
ELECTRODE REM PT RTRN 9FT ADLT (ELECTROSURGICAL) ×1 IMPLANT
EVACUATOR PREFILTER SMOKE (MISCELLANEOUS) ×2 IMPLANT
EXTENDER ELECT LOOP LEEP 10CM (CUTTING LOOP) IMPLANT
GAUZE SPONGE 4X4 16PLY XRAY LF (GAUZE/BANDAGES/DRESSINGS) IMPLANT
GLOVE BIO SURGEON STRL SZ7.5 (GLOVE) ×2 IMPLANT
GOWN STRL REIN XL XLG (GOWN DISPOSABLE) ×4 IMPLANT
HOSE NS SMOKE EVAC 7/8 X6 (MISCELLANEOUS) ×2 IMPLANT
NDL SPNL 22GX3.5 QUINCKE BK (NEEDLE) ×1 IMPLANT
NEEDLE SPNL 22GX3.5 QUINCKE BK (NEEDLE) ×2 IMPLANT
NS IRRIG 1000ML POUR BTL (IV SOLUTION) ×2 IMPLANT
PACK VAGINAL MINOR WOMEN LF (CUSTOM PROCEDURE TRAY) ×2 IMPLANT
PAD OB MATERNITY 4.3X12.25 (PERSONAL CARE ITEMS) ×2 IMPLANT
PENCIL BUTTON HOLSTER BLD 10FT (ELECTRODE) ×2 IMPLANT
REDUCER FITTING SMOKE EVAC (MISCELLANEOUS) ×2 IMPLANT
SCOPETTES 8  STERILE (MISCELLANEOUS) ×2
SCOPETTES 8 STERILE (MISCELLANEOUS) ×2 IMPLANT
SUT VIC AB 0 CT1 27 (SUTURE) ×2
SUT VIC AB 0 CT1 27XBRD ANBCTR (SUTURE) ×1 IMPLANT
SYR CONTROL 10ML LL (SYRINGE) ×2 IMPLANT
TOWEL OR 17X24 6PK STRL BLUE (TOWEL DISPOSABLE) ×4 IMPLANT
TUBING SMOKE EVAC HOSE ADAPTER (MISCELLANEOUS) ×2 IMPLANT
WATER STERILE IRR 1000ML POUR (IV SOLUTION) ×2 IMPLANT

## 2013-07-23 NOTE — H&P (Signed)
  The patient was examined.  I reviewed the proposed surgery and consent form with the patient.  The dictated history and physical is current and accurate and all questions were answered. The patient is ready to proceed with surgery and has a realistic understanding and expectation for the outcome.   Gwendolyn Auerbach MD, 7:10 AM 07/23/2013  @LOGO @

## 2013-07-23 NOTE — Anesthesia Preprocedure Evaluation (Signed)
Anesthesia Evaluation  Patient identified by MRN, date of birth, ID band Patient awake    Reviewed: Allergy & Precautions, H&P , Patient's Chart, lab work & pertinent test results, reviewed documented beta blocker date and time   Airway Mallampati: II TM Distance: >3 FB Neck ROM: full    Dental no notable dental hx.    Pulmonary  breath sounds clear to auscultation  Pulmonary exam normal       Cardiovascular Rhythm:regular Rate:Normal     Neuro/Psych    GI/Hepatic   Endo/Other    Renal/GU      Musculoskeletal   Abdominal   Peds  Hematology   Anesthesia Other Findings   Reproductive/Obstetrics                           Anesthesia Physical Anesthesia Plan  ASA: I  Anesthesia Plan: MAC   Post-op Pain Management:    Induction: Intravenous  Airway Management Planned: LMA, Mask and Natural Airway  Additional Equipment:   Intra-op Plan:   Post-operative Plan:   Informed Consent: I have reviewed the patients History and Physical, chart, labs and discussed the procedure including the risks, benefits and alternatives for the proposed anesthesia with the patient or authorized representative who has indicated his/her understanding and acceptance.   Dental Advisory Given  Plan Discussed with: CRNA and Surgeon  Anesthesia Plan Comments:         Anesthesia Quick Evaluation

## 2013-07-23 NOTE — Transfer of Care (Signed)
Immediate Anesthesia Transfer of Care Note  Patient: Dentist  Procedure(s) Performed: Procedure(s): LOOP ELECTROSURGICAL EXCISION PROCEDURE (LEEP) (N/A)  Patient Location: PACU  Anesthesia Type:General  Level of Consciousness: awake, alert  and oriented  Airway & Oxygen Therapy: Patient Spontanous Breathing and Patient connected to nasal cannula oxygen  Post-op Assessment: Report given to PACU RN and Post -op Vital signs reviewed and stable  Post vital signs: Reviewed and stable  Complications: No apparent anesthesia complications

## 2013-07-23 NOTE — Op Note (Signed)
Gwendolyn Rice 22-Sep-1990 [id number]   Post Operative Note   Date of surgery:  07/23/2013  Pre Op Dx:  Persistent high-grade dysplasia  Post Op Dx:  Persistent high-grade dysplasia  Procedure:  LEEP, ECC  Surgeon:  Dara Lords  Anesthesia:  General  EBL:  Minimal  Complications:  None  Specimen:  #1 LEEP, cut at 3:00 #2 ECC to pathology  Findings: EUA:  External BUS vagina normal. Cervix grossly normal. Uterus normal size anteverted midline mobile. Adnexa without masses    Procedure:  Patient was taken to the operating room, underwent general anesthesia, was placed in the low dorsal lithotomy position, received a perineal and vaginal preparation with Hibiclens per nursing personnel, was draped in usual fashion EUA was performed. A timeout was performed by the surgical team. The cervix was visualized with a LEEP speculum and circumferentially injected using 2% Xylocaine with 1-200,000 epinephrine solution. A total of 5 cc. Cervix was cleansed with acetic acid and colposcopy was performed with findings noted above. The cervix was subsequently stained with Lugol solution clearly outlining the transformations. Using the 12 x 20 loop, 60 W cutting/60 W coagulation blend one current, the LEEP specimen was excised in a single pass. The specimen was cut at 3:00 and pinned open. An ECC was performed and both specimens were sent to pathology. Several small bleeding points were addressed with ball coagulation and prophylactic Monsel was applied. The patient received intraoperative Toradol, was awakened without difficulty and taken to recovery in good condition having tolerated well.   Note: This document was prepared with digital dictation and possible smart phrase technology. Any transcriptional errors that result from this process are unintentional.  Dara Lords MD, 8:17 AM 07/23/2013

## 2013-07-23 NOTE — Anesthesia Postprocedure Evaluation (Signed)
Anesthesia Post Note  Patient: Gwendolyn Rice  Procedure(s) Performed: Procedure(s) (LRB): LOOP ELECTROSURGICAL EXCISION PROCEDURE (LEEP) (N/A)  Anesthesia type: General  Patient location: PACU  Post pain: Pain level controlled  Post assessment: Post-op Vital signs reviewed  Last Vitals:  Filed Vitals:   07/23/13 0845  BP: 110/66  Pulse: 99  Temp: 36.8 C  Resp: 30    Post vital signs: Reviewed  Level of consciousness: sedated  Complications: No apparent anesthesia complications

## 2013-07-26 ENCOUNTER — Encounter (HOSPITAL_COMMUNITY): Payer: Self-pay | Admitting: Gynecology

## 2013-08-09 ENCOUNTER — Encounter: Payer: Self-pay | Admitting: Gynecology

## 2013-08-09 ENCOUNTER — Ambulatory Visit (INDEPENDENT_AMBULATORY_CARE_PROVIDER_SITE_OTHER): Payer: Managed Care, Other (non HMO) | Admitting: Gynecology

## 2013-08-09 DIAGNOSIS — R87613 High grade squamous intraepithelial lesion on cytologic smear of cervix (HGSIL): Secondary | ICD-10-CM

## 2013-08-09 NOTE — Progress Notes (Signed)
Patient presents for her postoperative check status post LEEP 07/23/2013. She has done well since then without issue. Final pathology showed HGSIL involving endocervical glands with LGSIL extending to the endocervical and ectocervical margins. ECC showed benign endocervical tissue.  Exam with Sherrilyn Rist Asst. External BUS vagina with LEEP site healing nicely. Bimanual without masses or tenderness.  Assessment and plan: LEEP showing HGSIL involving endocervical glands. Both ectocervical and endocervical margins are involved with LGSIL. ECC was negative. Recommend followup in 4-6 months when she is due for her annual exam and will repeat Pap smear then.

## 2013-08-09 NOTE — Patient Instructions (Signed)
Followup for your annual exam in the April/May 2015.

## 2013-10-26 ENCOUNTER — Other Ambulatory Visit: Payer: Self-pay | Admitting: Gynecology

## 2013-11-11 ENCOUNTER — Other Ambulatory Visit (HOSPITAL_COMMUNITY)
Admission: RE | Admit: 2013-11-11 | Discharge: 2013-11-11 | Disposition: A | Discharge status: Home / Self Care | Payer: Managed Care, Other (non HMO) | Source: Ambulatory Visit | Attending: Gynecology | Admitting: Gynecology

## 2013-11-11 ENCOUNTER — Encounter: Payer: Self-pay | Admitting: Gynecology

## 2013-11-11 ENCOUNTER — Ambulatory Visit (INDEPENDENT_AMBULATORY_CARE_PROVIDER_SITE_OTHER): Payer: Managed Care, Other (non HMO) | Admitting: Gynecology

## 2013-11-11 VITALS — BP 120/74 | Ht 61.0 in | Wt 139.0 lb

## 2013-11-11 DIAGNOSIS — Z01419 Encounter for gynecological examination (general) (routine) without abnormal findings: Secondary | ICD-10-CM

## 2013-11-11 DIAGNOSIS — Z113 Encounter for screening for infections with a predominantly sexual mode of transmission: Secondary | ICD-10-CM

## 2013-11-11 MED ORDER — LORYNA 3-0.02 MG PO TABS: ORAL_TABLET | ORAL | Status: DC

## 2013-11-11 NOTE — Patient Instructions (Addendum)
Followup for Pap smear and cervical culture results. Assuming these are normal then followup in one year, sooner as needed.

## 2013-11-11 NOTE — Progress Notes (Addendum)
Gwendolyn MixerBrigitte Rice 1990/12/05 [id number]        23 y.o.  G0P0 for annual exam.  Doing well without complaints.  Past medical history,surgical history, problem list, medications, allergies, family history and social history were all reviewed and documented in the EPIC chart.  ROS:  Performed and pertinent positives and negatives are included in the history, assessment and plan .  Exam: [facility] assistant Filed Vitals:   11/11/13 1603  BP: 120/74  Height: 5\' 1"  (1.549 m)  Weight: 139 lb (63.05 kg)   General appearance  Normal Skin grossly normal Head/Neck normal with no cervical or supraclavicular adenopathy thyroid normal Lungs  clear Cardiac RR, without RMG Abdominal  soft, nontender, without masses, organomegaly or hernia Breasts  examined lying and sitting without masses, retractions, discharge or axillary adenopathy. Pelvic  Ext/BUS/vagina normal  Cervix mild distortion from LEEP grossly normal. Pap done  Uterus anteverted, normal size, shape and contour, midline and mobile nontender   Adnexa  Without masses or tenderness    Anus and perineum  Normal       Assessment/Plan:  23 y.o. G0P0 female for annual exam with regular menses, oral contraceptives.   1. HGSIL. History of HGSIL Pap smear ultimately leading to LEEP 07/2013.  Final pathology showed HGSIL involving endocervical glands. LGSIL extending to ectocervical and endocervical margins. ECC showed benign endocervical tissue. Pap done today. Follow up results and treat her these results. 2. Contraception. Patient on drospirenone containing pill doing well and wants to continue. I discussed slight increased risk of thrombosis to include stroke heart attack DVT. Patient understands accepts and I refilled her x1 year. 3. STD screening. GC/Chlamydia done. Encourage condom use regardless to help decrease STD risk. 4. Gardasil series received. 5. Breast health. SBE monthly reviewed. 6. Health maintenance. Baseline CBC urinalysis  ordered. Followup for Pap smear results otherwise annually.   Note: This document was prepared with digital dictation and possible smart phrase technology. Any transcriptional errors that result from this process are unintentional.   [name],Gwendolyn Mareno P MD, 4:45 PM 11/11/2013

## 2013-11-11 NOTE — Addendum Note (Signed)
Addended by: Dayna BarkerGARDNER, Tyrene Nader K on: 11/11/2013 04:57 PM   Modules accepted: Orders

## 2013-11-12 LAB — URINALYSIS W MICROSCOPIC + REFLEX CULTURE
BILIRUBIN URINE: NEGATIVE
Casts: NONE SEEN
GLUCOSE, UA: NEGATIVE mg/dL
Hgb urine dipstick: NEGATIVE
Ketones, ur: NEGATIVE mg/dL
Nitrite: NEGATIVE
PROTEIN: NEGATIVE mg/dL
Specific Gravity, Urine: 1.027 (ref 1.005–1.030)
Urobilinogen, UA: 0.2 mg/dL (ref 0.0–1.0)
pH: 6.5 (ref 5.0–8.0)

## 2013-11-12 LAB — CBC WITH DIFFERENTIAL/PLATELET
BASOS PCT: 1 % (ref 0–1)
Basophils Absolute: 0.1 10*3/uL (ref 0.0–0.1)
EOS PCT: 3 % (ref 0–5)
Eosinophils Absolute: 0.2 10*3/uL (ref 0.0–0.7)
HEMATOCRIT: 35.7 % — AB (ref 36.0–46.0)
Hemoglobin: 12.1 g/dL (ref 12.0–15.0)
Lymphocytes Relative: 41 % (ref 12–46)
Lymphs Abs: 3.3 10*3/uL (ref 0.7–4.0)
MCH: 30.2 pg (ref 26.0–34.0)
MCHC: 33.9 g/dL (ref 30.0–36.0)
MCV: 89 fL (ref 78.0–100.0)
MONO ABS: 0.9 10*3/uL (ref 0.1–1.0)
Monocytes Relative: 11 % (ref 3–12)
Neutro Abs: 3.5 10*3/uL (ref 1.7–7.7)
Neutrophils Relative %: 44 % (ref 43–77)
Platelets: 316 10*3/uL (ref 150–400)
RBC: 4.01 MIL/uL (ref 3.87–5.11)
RDW: 13.3 % (ref 11.5–15.5)
WBC: 8 10*3/uL (ref 4.0–10.5)

## 2013-11-12 LAB — GC/CHLAMYDIA PROBE AMP
CT Probe RNA: NEGATIVE
GC PROBE AMP APTIMA: NEGATIVE

## 2013-11-13 LAB — URINE CULTURE: Colony Count: 1000

## 2013-12-01 ENCOUNTER — Ambulatory Visit (INDEPENDENT_AMBULATORY_CARE_PROVIDER_SITE_OTHER): Payer: Managed Care, Other (non HMO) | Admitting: Family Medicine

## 2013-12-01 VITALS — BP 110/70 | HR 88 | Temp 98.1°F | Resp 16 | Ht 61.0 in | Wt 140.0 lb

## 2013-12-01 DIAGNOSIS — R300 Dysuria: Secondary | ICD-10-CM

## 2013-12-01 DIAGNOSIS — N390 Urinary tract infection, site not specified: Secondary | ICD-10-CM

## 2013-12-01 LAB — POCT URINALYSIS DIPSTICK
Bilirubin, UA: NEGATIVE
Glucose, UA: NEGATIVE
Ketones, UA: NEGATIVE
NITRITE UA: NEGATIVE
Spec Grav, UA: >=1.03
UROBILINOGEN UA: 0.2
pH, UA: 7

## 2013-12-01 LAB — POCT UA - MICROSCOPIC ONLY
CASTS, UR, LPF, POC: NEGATIVE
Crystals, Ur, HPF, POC: NEGATIVE
MUCUS UA: NEGATIVE
YEAST UA: NEGATIVE

## 2013-12-01 MED ORDER — CIPROFLOXACIN HCL 250 MG PO TABS: 250.0000 mg | ORAL_TABLET | Freq: Two times a day (BID) | ORAL | Status: DC

## 2013-12-01 MED ORDER — CIPROFLOXACIN HCL 250 MG PO TABS: 500.0000 mg | ORAL_TABLET | Freq: Two times a day (BID) | ORAL | Status: DC

## 2013-12-01 NOTE — Patient Instructions (Addendum)
We are going to treat you for a UTI with cipro- take this twice a day for 5 days.  Let me know if you do not feel better in the next 2 days or so- Sooner if worse.   Continue to drink plenty of fluids.    I will be in touch with your urine culture Don't forget that antibiotics (such as cipro) can interfere with your birth control pill

## 2013-12-01 NOTE — Progress Notes (Addendum)
[facility] [address], [place] [address] [phone]  Date:  12/01/2013   Name:  Gwendolyn Rice   DOB:  06-16-1991   MRN:  [id number]  PCP:  [name], MD    Chief Complaint: Dysuria   History of Present Illness:  Gwendolyn Rice is a 23 y.o. very pleasant female patient who presents with the following:  She notes dysuria for about 4 days.  She has been drinking a lot of fluids, but her sx are not quite resolved.  She also has to pee frequently.   She has never had a UTI, but her mom described these sx for her.   She has noted a "tiny" amount of blood in her urine.  No abdominal pain or back pain, no fever, chills or vomiting No vaginal sx LMP was about 10 days ago.  She reports that the LMP date in VS is not correct She is otherwise generally healthy   Patient Active Problem List   Diagnosis Date Noted  . History of kidney stones   . HGSIL (high grade squamous intraepithelial dysplasia) 11/17/2012    Past Medical History  Diagnosis Date  . History of kidney stones   . 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 Anginal pain     since childhoood, inflammation around the chest    Past Surgical History  Procedure Laterality Date  . Leep N/A 07/23/2013    Procedure: LOOP ELECTROSURGICAL EXCISION PROCEDURE (LEEP);  pathology HGSIL involving endocervical glands. Ectocervical and endocervical margins positive for LGSIL    History  Substance Use Topics  . Smoking status: Never Smoker   . Smokeless tobacco: Never Used  . Alcohol Use: Yes     Comment: Occas    History reviewed. No pertinent family history.  Allergies  Allergen Reactions  . Penicillins   . Sulfa Antibiotics     Medication list has been reviewed and updated.  Current Outpatient Prescriptions on File Prior to Visit  Medication Sig Dispense Refill  . ibuprofen (ADVIL,MOTRIN) 800 MG tablet Take 1 tablet (800  mg total) by mouth every 8 (eight) hours as needed.  30 tablet  1  . LORYNA 3-0.02 MG tablet TAKE 1 TABLET DAILY  84 tablet  4   No current facility-administered medications on file prior to visit.    Review of Systems:  As per HPI- otherwise negative.   Physical Examination: Filed Vitals:   12/01/13 1747  BP: 110/70  Pulse: 88  Temp: 98.1 F (36.7 C)  Resp: 16   Filed Vitals:   12/01/13 1747  Height: 5' 1"  (1.549 m)  Weight: 140 lb (63.504 kg)   Body mass index is 26.47 kg/(m^2). Ideal Body Weight: Weight in (lb) to have BMI = 25: 132  GEN: WDWN, NAD, Non-toxic, A & O x 3 HEENT: Atraumatic, Normocephalic. Neck supple. No masses, No LAD. Ears and Nose: No external deformity. CV: RRR, No M/G/R. No JVD. No thrill. No extra heart sounds. PULM: CTA B, no wheezes, crackles, rhonchi. No retractions. No resp. distress. No accessory muscle use. ABD: S, NT, ND- benign exam. No rebound. No HSM. No CVA tenderness  EXTR: No c/c/e NEURO Normal gait.  PSYCH: Normally interactive. Conversant. Not depressed or anxious appearing.  Calm demeanor.   Results for orders placed in visit on 12/01/13  POCT UA - MICROSCOPIC ONLY      Result Value Ref Range   WBC, Ur,  HPF, POC TNTC     RBC, urine, microscopic TNTC     Bacteria, U Microscopic 1+     Mucus, UA neg     Epithelial cells, urine per micros 3-6     Crystals, Ur, HPF, POC neg     Casts, Ur, LPF, POC neg     Yeast, UA neg    POCT URINALYSIS DIPSTICK      Result Value Ref Range   Color, UA yellow     Clarity, UA cloudy     Glucose, UA neg     Bilirubin, UA neg     Ketones, UA neg     Spec Grav, UA >=1.030     Blood, UA large     pH, UA 7.0     Protein, UA 3+     Urobilinogen, UA 0.2     Nitrite, UA neg     Leukocytes, UA moderate (2+)       Assessment and Plan: Dysuria - Plan: POCT UA - Microscopic Only, POCT urinalysis dipstick, Urine culture  UTI (urinary tract infection) - Plan: ciprofloxacin (CIPRO) 250 MG  tablet, DISCONTINUED: ciprofloxacin (CIPRO) 250 MG tablet  Treat for UTI with cipro.  Use pyridium as needed for pain, await culture See patient instructions for more details.     Signed Lamar Blinks, MD

## 2013-12-04 ENCOUNTER — Encounter: Payer: Self-pay | Admitting: Family Medicine

## 2013-12-04 LAB — URINE CULTURE: Colony Count: >=100000

## 2014-11-15 ENCOUNTER — Other Ambulatory Visit (HOSPITAL_COMMUNITY)
Admission: RE | Admit: 2014-11-15 | Discharge: 2014-11-15 | Disposition: A | Discharge status: Home / Self Care | Payer: Managed Care, Other (non HMO) | Source: Ambulatory Visit | Attending: Gynecology | Admitting: Gynecology

## 2014-11-15 ENCOUNTER — Encounter: Payer: Managed Care, Other (non HMO) | Admitting: Gynecology

## 2014-11-15 ENCOUNTER — Ambulatory Visit (INDEPENDENT_AMBULATORY_CARE_PROVIDER_SITE_OTHER): Payer: Managed Care, Other (non HMO) | Admitting: Gynecology

## 2014-11-15 ENCOUNTER — Encounter: Payer: Self-pay | Admitting: Gynecology

## 2014-11-15 VITALS — BP 116/74 | Ht 61.0 in | Wt 145.0 lb

## 2014-11-15 DIAGNOSIS — Z01419 Encounter for gynecological examination (general) (routine) without abnormal findings: Secondary | ICD-10-CM

## 2014-11-15 DIAGNOSIS — Z113 Encounter for screening for infections with a predominantly sexual mode of transmission: Secondary | ICD-10-CM | POA: Diagnosis not present

## 2014-11-15 MED ORDER — LORYNA 3-0.02 MG PO TABS: ORAL_TABLET | ORAL | Status: DC

## 2014-11-15 NOTE — Patient Instructions (Signed)
You may obtain a copy of any labs that were done today by logging onto MyChart as outlined in the instructions provided with your AVS (after visit summary). The office will not call with normal lab results but certainly if there are any significant abnormalities then we will contact you.   Health Maintenance, Female A healthy lifestyle and preventative care can promote health and wellness.  Maintain regular health, dental, and eye exams.  Eat a healthy diet. Foods like vegetables, fruits, whole grains, low-fat dairy products, and lean protein foods contain the nutrients you need without too many calories. Decrease your intake of foods high in solid fats, added sugars, and salt. Get information about a proper diet from your caregiver, if necessary.  Regular physical exercise is one of the most important things you can do for your health. Most adults should get at least 150 minutes of moderate-intensity exercise (any activity that increases your heart rate and causes you to sweat) each week. In addition, most adults need muscle-strengthening exercises on 2 or more days a week.   Maintain a healthy weight. The body mass index (BMI) is a screening tool to identify possible weight problems. It provides an estimate of body fat based on height and weight. Your caregiver can help determine your BMI, and can help you achieve or maintain a healthy weight. For adults 20 years and older:  A BMI below 18.5 is considered underweight.  A BMI of 18.5 to 24.9 is normal.  A BMI of 25 to 29.9 is considered overweight.  A BMI of 30 and above is considered obese.  Maintain normal blood lipids and cholesterol by exercising and minimizing your intake of saturated fat. Eat a balanced diet with plenty of fruits and vegetables. Blood tests for lipids and cholesterol should begin at age 61 and be repeated every 5 years. If your lipid or cholesterol levels are high, you are over 50, or you are a high risk for heart  disease, you may need your cholesterol levels checked more frequently.Ongoing high lipid and cholesterol levels should be treated with medicines if diet and exercise are not effective.  If you smoke, find out from your caregiver how to quit. If you do not use tobacco, do not start.  Lung cancer screening is recommended for adults aged 33 80 years who are at high risk for developing lung cancer because of a history of smoking. Yearly low-dose computed tomography (CT) is recommended for people who have at least a 30-pack-year history of smoking and are a current smoker or have quit within the past 15 years. A pack year of smoking is smoking an average of 1 pack of cigarettes a day for 1 year (for example: 1 pack a day for 30 years or 2 packs a day for 15 years). Yearly screening should continue until the smoker has stopped smoking for at least 15 years. Yearly screening should also be stopped for people who develop a health problem that would prevent them from having lung cancer treatment.  If you are pregnant, do not drink alcohol. If you are breastfeeding, be very cautious about drinking alcohol. If you are not pregnant and choose to drink alcohol, do not exceed 1 drink per day. One drink is considered to be 12 ounces (355 mL) of beer, 5 ounces (148 mL) of wine, or 1.5 ounces (44 mL) of liquor.  Avoid use of street drugs. Do not share needles with anyone. Ask for help if you need support or instructions about stopping  the use of drugs.  High blood pressure causes heart disease and increases the risk of stroke. Blood pressure should be checked at least every 1 to 2 years. Ongoing high blood pressure should be treated with medicines, if weight loss and exercise are not effective.  If you are 59 to 24 years old, ask your caregiver if you should take aspirin to prevent strokes.  Diabetes screening involves taking a blood sample to check your fasting blood sugar level. This should be done once every 3  years, after age 91, if you are within normal weight and without risk factors for diabetes. Testing should be considered at a younger age or be carried out more frequently if you are overweight and have at least 1 risk factor for diabetes.  Breast cancer screening is essential preventative care for women. You should practice "breast self-awareness." This means understanding the normal appearance and feel of your breasts and may include breast self-examination. Any changes detected, no matter how small, should be reported to a caregiver. Women in their 66s and 30s should have a clinical breast exam (CBE) by a caregiver as part of a regular health exam every 1 to 3 years. After age 101, women should have a CBE every year. Starting at age 100, women should consider having a mammogram (breast X-ray) every year. Women who have a family history of breast cancer should talk to their caregiver about genetic screening. Women at a high risk of breast cancer should talk to their caregiver about having an MRI and a mammogram every year.  Breast cancer gene (BRCA)-related cancer risk assessment is recommended for women who have family members with BRCA-related cancers. BRCA-related cancers include breast, ovarian, tubal, and peritoneal cancers. Having family members with these cancers may be associated with an increased risk for harmful changes (mutations) in the breast cancer genes BRCA1 and BRCA2. Results of the assessment will determine the need for genetic counseling and BRCA1 and BRCA2 testing.  The Pap test is a screening test for cervical cancer. Women should have a Pap test starting at age 57. Between ages 25 and 35, Pap tests should be repeated every 2 years. Beginning at age 37, you should have a Pap test every 3 years as long as the past 3 Pap tests have been normal. If you had a hysterectomy for a problem that was not cancer or a condition that could lead to cancer, then you no longer need Pap tests. If you are  between ages 50 and 76, and you have had normal Pap tests going back 10 years, you no longer need Pap tests. If you have had past treatment for cervical cancer or a condition that could lead to cancer, you need Pap tests and screening for cancer for at least 20 years after your treatment. If Pap tests have been discontinued, risk factors (such as a new sexual partner) need to be reassessed to determine if screening should be resumed. Some women have medical problems that increase the chance of getting cervical cancer. In these cases, your caregiver may recommend more frequent screening and Pap tests.  The human papillomavirus (HPV) test is an additional test that may be used for cervical cancer screening. The HPV test looks for the virus that can cause the cell changes on the cervix. The cells collected during the Pap test can be tested for HPV. The HPV test could be used to screen women aged 44 years and older, and should be used in women of any age  who have unclear Pap test results. After the age of 55, women should have HPV testing at the same frequency as a Pap test.  Colorectal cancer can be detected and often prevented. Most routine colorectal cancer screening begins at the age of 44 and continues through age 20. However, your caregiver may recommend screening at an earlier age if you have risk factors for colon cancer. On a yearly basis, your caregiver may provide home test kits to check for hidden blood in the stool. Use of a small camera at the end of a tube, to directly examine the colon (sigmoidoscopy or colonoscopy), can detect the earliest forms of colorectal cancer. Talk to your caregiver about this at age 86, when routine screening begins. Direct examination of the colon should be repeated every 5 to 10 years through age 13, unless early forms of pre-cancerous polyps or small growths are found.  Hepatitis C blood testing is recommended for all people born from [birth year] through 1965 and any  individual with known risks for hepatitis C.  Practice safe sex. Use condoms and avoid high-risk sexual practices to reduce the spread of sexually transmitted infections (STIs). Sexually active women aged 36 and younger should be checked for Chlamydia, which is a common sexually transmitted infection. Older women with new or multiple partners should also be tested for Chlamydia. Testing for other STIs is recommended if you are sexually active and at increased risk.  Osteoporosis is a disease in which the bones lose minerals and strength with aging. This can result in serious bone fractures. The risk of osteoporosis can be identified using a bone density scan. Women ages 20 and over and women at risk for fractures or osteoporosis should discuss screening with their caregivers. Ask your caregiver whether you should be taking a calcium supplement or vitamin D to reduce the rate of osteoporosis.  Menopause can be associated with physical symptoms and risks. Hormone replacement therapy is available to decrease symptoms and risks. You should talk to your caregiver about whether hormone replacement therapy is right for you.  Use sunscreen. Apply sunscreen liberally and repeatedly throughout the day. You should seek shade when your shadow is shorter than you. Protect yourself by wearing long sleeves, pants, a wide-brimmed hat, and sunglasses year round, whenever you are outdoors.  Notify your caregiver of new moles or changes in moles, especially if there is a change in shape or color. Also notify your caregiver if a mole is larger than the size of a pencil eraser.  Stay current with your immunizations. Document Released: 02/18/2011 Document Revised: 11/30/2012 Document Reviewed: 02/18/2011 [facility] Patient Information 2014 [employer].

## 2014-11-15 NOTE — Progress Notes (Signed)
Narda Pruiett 1990/12/16 [id number]        24 y.o.  G0P0 for annual exam.  Several issues noted below.  Past medical history,surgical history, problem list, medications, allergies, family history and social history were all reviewed and documented as reviewed in the EPIC chart.  ROS:  Performed with pertinent positives and negatives included in the history, assessment and plan.   Additional significant findings :  none   Exam: [facility] personassistant Filed Vitals:   11/15/14 1605  BP: 116/74  Height: 5\' 1"  (1.549 m)  Weight: 145 lb (65.772 kg)   General appearance:  Normal affect, orientation and appearance. Skin: Grossly normal HEENT: Without gross lesions.  No cervical or supraclavicular adenopathy. Thyroid normal.  Lungs:  Clear without wheezing, rales or rhonchi Cardiac: RR, without RMG Abdominal:  Soft, nontender, without masses, guarding, rebound, organomegaly or hernia Breasts:  Examined lying and sitting without masses, retractions, discharge or axillary adenopathy. Pelvic:  Ext/BUS/vagina normal  Cervix distorted from LEEP. Pap smear done.  Uterus anteverted, normal size, shape and contour, midline and mobile nontender   Adnexa  Without masses or tenderness    Anus and perineum  Normal    Assessment/Plan:  24 y.o. G0P0 female for annual exam with regular menses, oral contraceptives.   1. History of HGSIL.status post LEEP 07/2013. Final pathology showed HGSIL involving endocervical glands. LGSIL extending to ectocervical and endocervical margin.  ECC negative.  Follow up Pap smear 10/2013 negative. Pap smear done today. 2. Birth control. Patient on oral contraceptives doing well and wants to continue.  I again reviewed the risks to include increased risk of thrombosis such as stroke heart attack DVT with drospirenone-containing pills. Patient understands and accepts and refill 1 year provided. 3. STD screening. GC/Chlamydia screen done. Again encouraged condoms to help  decrease STD risk. 4. Gardasil series received. 5. Breast health. SBE monthly reviewed. 6. Health maintenance. Check urinalysis today. CBC normal last year. Patient very reluctant for lab work and I think given the stability of her blood count of the last several years and no change in her menstrual status will hold on blood work now. Follow up in one year assuming Pap smear returns normal. Sooner if any issues.     [name],Moris Ratchford P MD, 4:55 PM 11/15/2014

## 2014-11-16 ENCOUNTER — Encounter: Payer: Self-pay | Admitting: Gynecology

## 2014-11-16 LAB — URINALYSIS W MICROSCOPIC + REFLEX CULTURE
BILIRUBIN URINE: NEGATIVE
Casts: NONE SEEN
Crystals: NONE SEEN
Glucose, UA: NEGATIVE mg/dL
Hgb urine dipstick: NEGATIVE
Ketones, ur: NEGATIVE mg/dL
Nitrite: NEGATIVE
PH: 6 (ref 5.0–8.0)
Protein, ur: NEGATIVE mg/dL
SPECIFIC GRAVITY, URINE: 1.018 (ref 1.005–1.030)
Urobilinogen, UA: 0.2 mg/dL (ref 0.0–1.0)

## 2014-11-16 LAB — GC/CHLAMYDIA PROBE AMP
CT Probe RNA: NEGATIVE
GC PROBE AMP APTIMA: NEGATIVE

## 2014-11-17 LAB — URINE CULTURE: Colony Count: 2000

## 2014-11-18 LAB — CYTOLOGY - PAP

## 2015-07-10 ENCOUNTER — Telehealth: Payer: Self-pay

## 2015-07-10 ENCOUNTER — Ambulatory Visit (INDEPENDENT_AMBULATORY_CARE_PROVIDER_SITE_OTHER): Payer: Managed Care, Other (non HMO) | Admitting: Internal Medicine

## 2015-07-10 ENCOUNTER — Encounter: Payer: Self-pay | Admitting: Internal Medicine

## 2015-07-10 VITALS — BP 118/72 | HR 106 | Temp 98.2°F | Ht 61.5 in | Wt 135.0 lb

## 2015-07-10 DIAGNOSIS — L237 Allergic contact dermatitis due to plants, except food: Secondary | ICD-10-CM

## 2015-07-10 DIAGNOSIS — L03119 Cellulitis of unspecified part of limb: Secondary | ICD-10-CM | POA: Diagnosis not present

## 2015-07-10 MED ORDER — DOXYCYCLINE HYCLATE 100 MG PO TABS: 100.0000 mg | ORAL_TABLET | Freq: Two times a day (BID) | ORAL | Status: DC

## 2015-07-10 MED ORDER — METHYLPREDNISOLONE ACETATE 80 MG/ML IJ SUSP
80.0000 mg | Freq: Once | INTRAMUSCULAR | Status: AC
  Administered 2015-07-10: 80 mg via INTRAMUSCULAR

## 2015-07-10 MED ORDER — PREDNISONE 20 MG PO TABS: ORAL_TABLET | ORAL | Status: DC

## 2015-07-10 NOTE — Telephone Encounter (Signed)
Pt called to see what to put on poison ivy; reviewed instructions at 07/10/15 visit and advised pt to take meds, use calamine lotion and oatmeal baths and if not better cb. Pt voiced understanding.

## 2015-07-10 NOTE — Patient Instructions (Signed)
Poison [employer] ivy is a inflammation of the skin (contact dermatitis) caused by touching the allergens on the leaves of the ivy plant following previous exposure to the plant. The rash usually appears 48 hours after exposure. The rash is usually bumps (papules) or blisters (vesicles) in a linear pattern. Depending on your own sensitivity, the rash may simply cause redness and itching, or it may also progress to blisters which may break open. These must be well cared for to prevent secondary bacterial (germ) infection, followed by scarring. Keep any open areas dry, clean, dressed, and covered with an antibacterial ointment if needed. The eyes may also get puffy. The puffiness is worst in the morning and gets better as the day progresses. This dermatitis usually heals without scarring, within 2 to 3 weeks without treatment. HOME CARE INSTRUCTIONS  Thoroughly wash with soap and water as soon as you have been exposed to poison ivy. You have about one half hour to remove the plant resin before it will cause the rash. This washing will destroy the oil or antigen on the skin that is causing, or will cause, the rash. Be sure to wash under your fingernails as any plant resin there will continue to spread the rash. Do not rub skin vigorously when washing affected area. Poison ivy cannot spread if no oil from the plant remains on your body. A rash that has progressed to weeping sores will not spread the rash unless you have not washed thoroughly. It is also important to wash any clothes you have been wearing as these may carry active allergens. The rash will return if you wear the unwashed clothing, even several days later. Avoidance of the plant in the future is the best measure. Poison ivy plant can be recognized by the number of leaves. Generally, poison ivy has three leaves with flowering branches on a single stem. Diphenhydramine may be purchased over the counter and used as needed for itching. Do not drive with  this medication if it makes you drowsy.Ask your caregiver about medication for children. SEEK MEDICAL CARE IF:  Open sores develop.  Redness spreads beyond area of rash.  You notice purulent (pus-like) discharge.  You have increased pain.  Other signs of infection develop (such as fever).   This information is not intended to replace advice given to you by your health care provider. Make sure you discuss any questions you have with your health care provider.   Document Released: 08/02/2000 Document Revised: 10/28/2011 Document Reviewed: 01/11/2015 Elsevier Interactive Patient Education [employer].

## 2015-07-10 NOTE — Progress Notes (Signed)
HPI  Pt presents to the clinic today to establish care. She has not had a PCP in many years but has been getting care from GYN.  Flu: never Pap Smear: 2015 Dentist: as needed  She is concerned about a rash. This started 2 weeks ago. It started on her butt and has spread all over her body, including her face. She thinks it is poison ivy because she used the bathroom outside in the woods one day when she was walking her dogs. She has tried Calamine lotion and alcohol without any relief. She denies changes in soaps lotions and detergents.  Past Medical History  Diagnosis Date  . History of kidney stones   . 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 ibuprofen (ADVIL,MOTRIN) 800 MG tablet Take 1 tablet (800 mg total) by mouth every 8 (eight) hours as needed. 30 tablet 1  . LORYNA 3-0.02 MG tablet TAKE 1 TABLET DAILY 84 tablet 4   No current facility-administered medications for this visit.    Allergies  Allergen Reactions  . Amoxicillin   . Ciprofloxacin   . Sulfa Antibiotics   . Penicillins     Family History  Problem Relation Age of Onset  . Heart attack Maternal Grandmother     Social History   Social History  . Marital Status: Single    Spouse Name: N/A  . Number of Children: N/A  . Years of Education: N/A   Occupational History  . Not on file.   Social History Main Topics  . Smoking status: Never Smoker   . Smokeless tobacco: Never Used  . Alcohol Use: 0.0 oz/week    0 Standard drinks or equivalent per week     Comment: Occas  . Drug Use: No  . Sexual Activity: Yes    Birth Control/ Protection: Pill     Comment: 1st intercourse 48 yo-5 partners   Other Topics Concern  . Not on file   Social History Narrative    ROS:  Constitutional: Denies fever, malaise,  fatigue, headache or abrupt weight changes.  HEENT: Denies eye pain, eye redness, ear pain, ringing in the ears, wax buildup, runny nose, nasal congestion, bloody nose, or sore throat. Respiratory: Denies difficulty breathing, shortness of breath, cough or sputum production.   Cardiovascular: Denies chest pain, chest tightness, palpitations or swelling in the hands or feet.  Gastrointestinal: Denies abdominal pain, bloating, constipation, diarrhea or blood in the stool.  GU: Denies frequency, urgency, pain with urination, blood in urine, odor or discharge. Musculoskeletal: Denies decrease in range of motion, difficulty with gait, muscle pain or joint pain and swelling.  Skin: Pt reports rash.   Neurological: Denies dizziness, difficulty with memory, difficulty with speech or problems with balance and coordination.  Psych: Denies anxiety, depression, SI/HI.  No other specific complaints in a complete review of systems (except as listed in HPI above).  PE:  BP 118/72 mmHg  Pulse 106  Temp(Src) 98.2 F (36.8 C) (Oral)  Ht 5' 1.5" (1.562 m)  Wt 135 lb (61.236 kg)  BMI 25.10 kg/m2  SpO2 98%  LMP 07/05/2015 Wt Readings from Last 3 Encounters:  07/10/15 135 lb (61.236 kg)  11/15/14 145 lb (65.772 kg)  12/01/13 140 lb (63.504 kg)    General: Appears her stated age, well developed, well  nourished in NAD. Cardiovascular: Normal rate and rhythm. S1,S2 noted.  No murmur, rubs or gallops noted.  Pulmonary/Chest: Normal effort and positive vesicular breath sounds. No respiratory distress. No wheezes, rales or ronchi noted.  Skin: Scattered, grouped vesicular lesion on erythematous base. Some lesions crusted over. Some draining clear fluid. Large area of cellulitis noted on left thigh.  Neuro: Alert and oriented. Psych: She does engage and make eye contact.  Assessment and Plan:  Poison ivy with cellulitis:  eRx for Pred Taper x 12 days Continue Calamine Lotion Try an Oatmeal bath 80 mg  Depo IM today eRx for Doxycycline 100 mg BID x 10 days  RTC as needed or if symptoms persist or worsen

## 2015-07-10 NOTE — Addendum Note (Signed)
Addended by: Roena MaladyEVONTENNO, Kharter Brew Y on: 07/10/2015 05:05 PM   Modules accepted: Orders

## 2015-07-11 ENCOUNTER — Telehealth: Payer: Self-pay | Admitting: Internal Medicine

## 2015-07-11 ENCOUNTER — Telehealth: Payer: Self-pay

## 2015-07-11 DIAGNOSIS — Z7689 Persons encountering health services in other specified circumstances: Secondary | ICD-10-CM

## 2015-07-11 NOTE — Telephone Encounter (Signed)
She can try Ibuprofen 600 mg TID with meals Ok for work note to be out through Monday Forms signed and given back to [employer]

## 2015-07-11 NOTE — Telephone Encounter (Signed)
Pt would like note to be out the rest of the week and to return on Monday  cb number is [phone] when ready to be picked up  Thank you

## 2015-07-11 NOTE — Telephone Encounter (Signed)
Pt aware paperwork is ready She will pick and turn paperwork into her employer Copy for pt Copy for file Copy for scan Copy for billing

## 2015-07-11 NOTE — Telephone Encounter (Signed)
Agree with advice given

## 2015-07-11 NOTE — Telephone Encounter (Signed)
FMLA paperwork In [employer] IN BOX  For review and signature  See below notes

## 2015-07-11 NOTE — Telephone Encounter (Signed)
Pt is concerned that she is taking prednisone and doxycycline; pt thinks both meds are same as penicillin; advised pt that prednisone and doxycycline are not part of penicillin family; pt is not having any problems or side effects. Pt voiced understanding. Sending to Pamala Hurry Baity NP as Lorain ChildesFYI.

## 2015-07-11 NOTE — Telephone Encounter (Signed)
Pt dopped of fmla paperwork She wanted to let you know her left leg is very painful to stand on.  What else can she do for the pain. She stated it looks better than it did yesterday.  But still painful does this mean it is healing

## 2015-07-16 ENCOUNTER — Ambulatory Visit (INDEPENDENT_AMBULATORY_CARE_PROVIDER_SITE_OTHER): Payer: Managed Care, Other (non HMO) | Admitting: Internal Medicine

## 2015-07-16 VITALS — BP 122/80 | HR 100 | Temp 98.6°F | Resp 16 | Ht 61.0 in | Wt 136.0 lb

## 2015-07-16 DIAGNOSIS — L259 Unspecified contact dermatitis, unspecified cause: Secondary | ICD-10-CM

## 2015-07-16 DIAGNOSIS — L739 Follicular disorder, unspecified: Secondary | ICD-10-CM

## 2015-07-16 MED ORDER — TRIAMCINOLONE ACETONIDE 0.1 % EX CREA: 1.0000 "application " | TOPICAL_CREAM | Freq: Two times a day (BID) | CUTANEOUS | Status: DC

## 2015-07-16 MED ORDER — PREDNISONE 20 MG PO TABS: ORAL_TABLET | ORAL | Status: DC

## 2015-07-16 NOTE — Progress Notes (Addendum)
Subjective:  This chart was scribed for Gwendolyn Sia, MD by Andrew Au, ED Scribe. This patient was seen in room 3 and the patient's care was started at 11:17 AM.   Patient ID: Gwendolyn Rice, female    DOB: 10/19/1990, 24 y.o.   MRN: [id number]  HPI   Chief Complaint  Patient presents with  . Poison Ivy    arms, legs, and face x 2 weeks   HPI Comments: Gwendolyn Rice is a 24 y.o. female who presents to the [facility] complaining of itchy rash to face arms and legs. Pt states rash started a month ago to leg and arm  after doing yard work 1 day prior. At that time rash was small, minimally irritating and thought is would clear on its own. She also did not have fever, chills, sweats, or joint pain at that time.  Rash worsened 6 days ago, appearing on her face and neck cause her right eye lid to swell. She shows [place] pictures from her cell phone that indicate redness and swelling over her face with L eye almost completely shut. She was seen at another facility 6 days ago and was treated with doxy and a 12 day course of prednisone. Since that visit, rash improved for a few days but now is getting worse. No fever, chills, sweats, or joint pain at that time.  Pt lives in a [place] area and is outside daily. She has puppies that often play outside but not in the woods. She does have neighbors that have been burning trash and wood outside. She was exposed to smoke just prior to her new rash 1 week ago.  Pt also has irritation to both axilla caused by deodorant. Shaves bi weekly  Past Medical History  Diagnosis Date  . History of kidney stones   . 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 Anginal pain ([facility])     since childhoood, inflammation around the chest   Allergies  Allergen Reactions  . Amoxicillin   . Ciprofloxacin   . Sulfa Antibiotics   . Penicillins    Prior to Admission medications   Medication Sig  Start Date End Date Taking? Authorizing Provider  doxycycline (VIBRA-TABS) 100 MG tablet Take 1 tablet (100 mg total) by mouth 2 (two) times daily. 07/10/15  Yes Lorre Munroe, NP  ibuprofen (ADVIL,MOTRIN) 800 MG tablet Take 1 tablet (800 mg total) by mouth every 8 (eight) hours as needed. 07/23/13  Yes Dara Lords, MD  LORYNA 3-0.02 MG tablet TAKE 1 TABLET DAILY 11/15/14  Yes Dara Lords, MD  predniSONE (DELTASONE) 20 MG tablet Take 3 tabs on days 1-4, take 2 tabs on days 5-8, take 1 tab on days 9-12 07/10/15  Yes Lorre Munroe, NP   Review of Systems  Constitutional: Negative for fever, chills, diaphoresis and fatigue.  Musculoskeletal: Negative for arthralgias.  Skin: Positive for color change and rash.   Objective:   Physical Exam  Constitutional: She is oriented to person, place, and time. She appears well-developed and well-nourished. No distress.  HENT:  Head: Normocephalic and atraumatic.  Eyes: Conjunctivae and EOM are normal.  Neck: Neck supple.  Cardiovascular: Normal rate.   Pulmonary/Chest: Effort normal.  Musculoskeletal: Normal range of motion.  Neurological: She is alert and oriented to person, place, and time.  Skin: Skin is warm and dry.  She has a fine papulovesicular rash over her face particularly forehead.  Her eyes are uninvolved and the conjunctiva clear. There are numerous papular red lesions on her arms and legs without vesiculation or crusting. The abdomen and back spared. She has old scaly areas on both buttocks and the left anterior leg without inflammation.  The underarms have scattered follicular irritation spreading beyond the hair zone that are not currently actively inflamed stage of resolution.  Psychiatric: She has a normal mood and affect. Her behavior is normal.  Nursing note and vitals reviewed.    Filed Vitals:   07/16/15 1108  BP: 122/80  Pulse: 100  Temp: 98.6 F (37 C)  TempSrc: Oral  Resp: 16  Height: 5\' 1"  (1.549 m)    Weight: 136 lb (61.689 kg)  SpO2: 98%    Assessment & Plan:  I have completed the patient encounter in its entirety as documented by the scribe, with editing by me where necessary. Abria Vannostrand P. Merla Richesoolittle, M.D.  Contact dermatitis--this pattern today is suspicious for exposure to poison ivy that has been burned. She also may have poison ivy on the fur of her puppies  Folliculitis-axillary -scrub daily  -triam for any infl or itch -scrub after shaving

## 2015-11-21 ENCOUNTER — Encounter: Payer: Managed Care, Other (non HMO) | Admitting: Gynecology

## 2015-12-28 ENCOUNTER — Encounter: Payer: Self-pay | Admitting: Gynecology

## 2015-12-28 ENCOUNTER — Ambulatory Visit (INDEPENDENT_AMBULATORY_CARE_PROVIDER_SITE_OTHER): Payer: Managed Care, Other (non HMO) | Admitting: Gynecology

## 2015-12-28 VITALS — BP 114/76 | Ht 61.0 in | Wt 142.0 lb

## 2015-12-28 DIAGNOSIS — Z113 Encounter for screening for infections with a predominantly sexual mode of transmission: Secondary | ICD-10-CM | POA: Diagnosis not present

## 2015-12-28 DIAGNOSIS — Z01419 Encounter for gynecological examination (general) (routine) without abnormal findings: Secondary | ICD-10-CM

## 2015-12-28 MED ORDER — LORYNA 3-0.02 MG PO TABS: ORAL_TABLET | ORAL | Status: DC

## 2015-12-28 NOTE — Progress Notes (Signed)
    Gwendolyn Rice 1991-06-11 [id number]        25 y.o.  G0P0  for annual exam.  Doing well without complaints  Past medical history,surgical history, problem list, medications, allergies, family history and social history were all reviewed and documented as reviewed in the EPIC chart.  ROS:  Performed with pertinent positives and negatives included in the history, assessment and plan.   Additional significant findings :  none   Exam: Gwendolyn Rice assistant Filed Vitals:   12/28/15 1554  BP: 114/76  Height: 5' 1"  (1.549 m)  Weight: 142 lb (64.411 kg)   General appearance:  Normal affect, orientation and appearance. Skin: Grossly normal HEENT: Without gross lesions.  No cervical or supraclavicular adenopathy. Thyroid normal.  Lungs:  Clear without wheezing, rales or rhonchi Cardiac: RR, without RMG Abdominal:  Soft, nontender, without masses, guarding, rebound, organomegaly or hernia Breasts:  Examined lying and sitting without masses, retractions, discharge or axillary adenopathy. Pelvic:  Ext/BUS/vagina normal  Cervix some distortion from her LEEP. Pap smear done  Uterus anteverted, normal size, shape and contour, midline and mobile nontender   Adnexa without masses or tenderness    Anus and perineum normal    Assessment/Plan:  25 y.o. G0P0 female for annual exam with regular menses, oral contraceptives.   1. Birth control. Patient continues on low-dose oral contraceptives.  Yaz equivalent.  Reviewed risks to include possible increased risk of thrombosis such as stroke heart attack DVT. Patient comfortable continuing and refill 1 year provided. 2. History of LEEP 2014 for HGSIL. Involved endocervical glands as well as echo and endocervical margin. ECC was negative. Follow up Pap smear 2015 and 2016 negative. Pap smear done today. If normal then we'll plan less frequent screening intervals. 3. STD screening. GC/committee of done. Encouraged used condoms to help decrease STD  risk. 4. Breast health. SBE monthly reviewed. 5. Gardasil series received. 6. Health maintenance. 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 She has no signs or symptoms to suggest issues. Will check urinalysis. Follow up in one year, sooner as needed.   Anastasio Auerbach MD, 4:13 PM 12/28/2015

## 2015-12-28 NOTE — Addendum Note (Signed)
Addended by: Dayna BarkerGARDNER, Anjel Pardo K on: 12/28/2015 04:21 PM   Modules accepted: Orders, SmartSet

## 2015-12-28 NOTE — Patient Instructions (Signed)

## 2015-12-29 ENCOUNTER — Telehealth: Payer: Self-pay

## 2015-12-29 LAB — URINALYSIS W MICROSCOPIC + REFLEX CULTURE
Bilirubin Urine: NEGATIVE
Casts: NONE SEEN [LPF]
Crystals: NONE SEEN [HPF]
Glucose, UA: NEGATIVE
Hgb urine dipstick: NEGATIVE
Ketones, ur: NEGATIVE
Nitrite: NEGATIVE
Protein, ur: NEGATIVE
Specific Gravity, Urine: 1.014 (ref 1.001–1.035)
Yeast: NONE SEEN [HPF]
pH: 7 (ref 5.0–8.0)

## 2015-12-29 LAB — PAP IG W/ RFLX HPV ASCU

## 2015-12-29 LAB — GC/CHLAMYDIA PROBE AMP
CT Probe RNA: NOT DETECTED
GC Probe RNA: NOT DETECTED

## 2015-12-29 MED ORDER — DROSPIRENONE-ETHINYL ESTRADIOL 3-0.02 MG PO TABS: 1.0000 | ORAL_TABLET | Freq: Every day | ORAL | Status: DC

## 2015-12-29 NOTE — Telephone Encounter (Signed)
Note from pharmacy: " We do not stock Loryna 24 0.47m-3 mg tab (28EA). We do stock Drospirenone-Eth Estradiol 24 0.030m3mg tab (28EA)."    They ask if you approve or disapprove of conversion?

## 2015-12-29 NOTE — Addendum Note (Signed)
Addended by: Ramond Craver on: 12/29/2015 09:52 AM   Modules accepted: Orders

## 2015-12-29 NOTE — Telephone Encounter (Signed)
Ok to substitute?

## 2015-12-31 LAB — URINE CULTURE: Colony Count: >=100000

## 2016-01-04 ENCOUNTER — Other Ambulatory Visit: Payer: Self-pay | Admitting: Gynecology

## 2016-01-04 MED ORDER — NITROFURANTOIN MONOHYD MACRO 100 MG PO CAPS: 100.0000 mg | ORAL_CAPSULE | Freq: Two times a day (BID) | ORAL | Status: DC

## 2016-02-07 ENCOUNTER — Ambulatory Visit (INDEPENDENT_AMBULATORY_CARE_PROVIDER_SITE_OTHER): Payer: Managed Care, Other (non HMO) | Admitting: Physician Assistant

## 2016-02-07 VITALS — BP 118/64 | HR 101 | Temp 98.1°F | Resp 18 | Ht 61.0 in | Wt 139.0 lb

## 2016-02-07 DIAGNOSIS — R519 Headache, unspecified: Secondary | ICD-10-CM

## 2016-02-07 DIAGNOSIS — M67439 Ganglion, unspecified wrist: Secondary | ICD-10-CM | POA: Insufficient documentation

## 2016-02-07 DIAGNOSIS — R110 Nausea: Secondary | ICD-10-CM

## 2016-02-07 DIAGNOSIS — R51 Headache: Secondary | ICD-10-CM

## 2016-02-07 DIAGNOSIS — R42 Dizziness and giddiness: Secondary | ICD-10-CM

## 2016-02-07 DIAGNOSIS — M67432 Ganglion, left wrist: Secondary | ICD-10-CM | POA: Diagnosis not present

## 2016-02-07 LAB — CBC WITH DIFFERENTIAL/PLATELET
BASOS ABS: 0 {cells}/uL (ref 0–200)
Basophils Relative: 0 %
Eosinophils Absolute: 125 cells/uL (ref 15–500)
Eosinophils Relative: 1 %
HCT: 42.4 % (ref 35.0–45.0)
HEMOGLOBIN: 14.2 g/dL (ref 11.7–15.5)
LYMPHS ABS: 2000 {cells}/uL (ref 850–3900)
LYMPHS PCT: 16 %
MCH: 30.3 pg (ref 27.0–33.0)
MCHC: 33.5 g/dL (ref 32.0–36.0)
MCV: 90.4 fL (ref 80.0–100.0)
MPV: 9.5 fL (ref 7.5–12.5)
Monocytes Absolute: 750 cells/uL (ref 200–950)
Monocytes Relative: 6 %
NEUTROS PCT: 77 %
Neutro Abs: 9625 cells/uL — ABNORMAL HIGH (ref 1500–7800)
Platelets: 379 10*3/uL (ref 140–400)
RBC: 4.69 MIL/uL (ref 3.80–5.10)
RDW: 13.6 % (ref 11.0–15.0)
WBC: 12.5 10*3/uL — AB (ref 3.8–10.8)

## 2016-02-07 LAB — POCT URINALYSIS DIP (MANUAL ENTRY)
BILIRUBIN UA: NEGATIVE
Glucose, UA: NEGATIVE
Ketones, POC UA: NEGATIVE
NITRITE UA: NEGATIVE
Protein Ur, POC: NEGATIVE
RBC UA: NEGATIVE
SPEC GRAV UA: 1.02
UROBILINOGEN UA: 0.2
pH, UA: 6

## 2016-02-07 LAB — COMPREHENSIVE METABOLIC PANEL
ALT: 34 U/L — ABNORMAL HIGH (ref 6–29)
AST: 25 U/L (ref 10–30)
Albumin: 4.3 g/dL (ref 3.6–5.1)
Alkaline Phosphatase: 59 U/L (ref 33–115)
BILIRUBIN TOTAL: 0.6 mg/dL (ref 0.2–1.2)
BUN: 11 mg/dL (ref 7–25)
CO2: 25 mmol/L (ref 20–31)
Calcium: 9.8 mg/dL (ref 8.6–10.2)
Chloride: 104 mmol/L (ref 98–110)
Creat: 0.7 mg/dL (ref 0.50–1.10)
Glucose, Bld: 93 mg/dL (ref 65–99)
POTASSIUM: 3.8 mmol/L (ref 3.5–5.3)
Sodium: 139 mmol/L (ref 135–146)
TOTAL PROTEIN: 7.9 g/dL (ref 6.1–8.1)

## 2016-02-07 LAB — POC MICROSCOPIC URINALYSIS (UMFC): Mucus: ABSENT

## 2016-02-07 LAB — TSH: TSH: 1.19 m[IU]/L

## 2016-02-07 LAB — POCT URINE PREGNANCY: PREG TEST UR: NEGATIVE

## 2016-02-07 MED ORDER — ONDANSETRON 8 MG PO TBDP: 8.0000 mg | ORAL_TABLET | Freq: Three times a day (TID) | ORAL | Status: DC | PRN

## 2016-02-07 MED ORDER — RANITIDINE HCL 150 MG PO TABS: 150.0000 mg | ORAL_TABLET | Freq: Two times a day (BID) | ORAL | Status: DC

## 2016-02-07 NOTE — Progress Notes (Signed)
Patient ID: Gwendolyn HuggerBrigitte Rice, female    DOB: 01/23/1991, 25 y.o.   MRN: [id number]  PCP: Nicki ReaperBAITY, REGINA, NP  Subjective:   Chief Complaint  Patient presents with  . Cyst    On right hand   . Dizziness    HPI Presents for evaluation of 2 concerns:  1. Dizziness, lightheadedness and headache x 1 week. "Someone said I might be pregnant. Or I might be low on something." Some nasal congestion, "allergies." Occasional sore throat or ear pressure. No change in urinary urgency, frequency. No burning. Notes significant frequency always. No atypical vaginal discharge. No breast tenderness. Sexually active. LMP 01/08/2016. Anticipates menstrual bleed in the next several days and is experiencing cramping typical for her. Nausea, typical for her at this time in her pill pack. Uses COC, no missed pills.  Increased fatigue. 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 "I don't get enough rest."  Lives with her parents. 25 year old brother.   2. Tender lump on the LEFT wrist. Hurts when she uses it. RIGHT hand dominant. "I don't want an x-ray."     Review of Systems As above.    Patient Active Problem List   Diagnosis Date Noted  . History of kidney stones   . HGSIL (high grade squamous intraepithelial dysplasia) 11/17/2012     Prior to Admission medications   Medication Sig Start Date End Date Taking? Authorizing Provider  drospirenone-ethinyl estradiol (YAZ,GIANVI,LORYNA) 3-0.02 MG tablet Take 1 tablet by mouth daily. 12/29/15  Yes Dara Lordsimothy P Fontaine, MD  ibuprofen (ADVIL,MOTRIN) 800 MG tablet Take 1 tablet (800 mg total) by mouth every 8 (eight) hours as needed. 07/23/13  Yes Dara Lordsimothy P Fontaine, MD     Allergies  Allergen Reactions  . Amoxicillin Swelling  . Ciprofloxacin Swelling  . Penicillins Swelling  . Sulfa Antibiotics Swelling       Objective:  Physical Exam  Constitutional: She is oriented to person, place, and time. She appears well-developed  and well-nourished. She is active and cooperative. No distress.  BP 118/64 mmHg  Pulse 101  Temp(Src) 98.1 F (36.7 C) (Oral)  Resp 18  Ht 5\' 1"  (1.549 m)  Wt 139 lb (63.05 kg)  BMI 26.28 kg/m2  SpO2 98%  LMP 01/08/2016  HENT:  Head: Normocephalic and atraumatic.  Right Ear: Hearing, tympanic membrane, external ear and ear canal normal.  Left Ear: Hearing, tympanic membrane, external ear and ear canal normal.  Nose: Nose normal.  Mouth/Throat: Oropharynx is clear and moist and mucous membranes are normal. No oral lesions. Dental caries present. No uvula swelling. No oropharyngeal exudate.  Eyes: Conjunctivae, EOM and lids are normal. Pupils are equal, round, and reactive to light. No scleral icterus.  Fundoscopic exam:      The right eye shows no hemorrhage and no papilledema. The right eye shows red reflex.       The left eye shows no hemorrhage and no papilledema. The left eye shows red reflex.  Neck: Normal range of motion, full passive range of motion without pain and phonation normal. Neck supple. No spinous process tenderness and no muscular tenderness present. No thyromegaly present.  Cardiovascular: Normal rate, regular rhythm and normal heart sounds.   Pulses:      Radial pulses are 2+ on the right side, and 2+ on the left side.  Pulmonary/Chest: Effort normal and breath sounds normal.  Musculoskeletal:       Left wrist: She exhibits tenderness (of 1 cm cyst on the dorsal wrist,  consistent with ganglion cyst). She exhibits normal range of motion.  Lymphadenopathy:       Head (right side): No tonsillar, no preauricular, no posterior auricular and no occipital adenopathy present.       Head (left side): No tonsillar, no preauricular, no posterior auricular and no occipital adenopathy present.    She has no cervical adenopathy.       Right: No supraclavicular adenopathy present.       Left: No supraclavicular adenopathy present.  Neurological: She is alert and oriented to  person, place, and time. No sensory deficit.  Skin: Skin is warm, dry and intact. No rash noted. No cyanosis or erythema. Nails show no clubbing.  Psychiatric: She has a normal mood and affect. Her mood appears not anxious. Her affect is not angry, not blunt, not labile and not inappropriate. Her speech is not rapid and/or pressured, not delayed, not tangential and not slurred. She is not agitated, not aggressive, not hyperactive, not slowed, not withdrawn, not actively hallucinating and not combative. Thought content is not paranoid and not delusional. Cognition and memory are not impaired. She does not express impulsivity or inappropriate judgment. She does not exhibit a depressed mood. She expresses no homicidal and no suicidal ideation. She is communicative.  Giggles during interview and exam. She is attentive.           Assessment & Plan:   1. Dizziness and giddiness 2. Nonintractable headache, unspecified chronicity pattern, unspecified headache type Due to the long wait today at our office, she is really pressed for time. We agree that after providing the urine a blood specimens, I will contact her by phone with the results. We will develop a plan once the results are available. - CBC with Differential/Platelet - TSH - Comprehensive metabolic panel - POCT urinalysis dipstick - POCT Microscopic Urinalysis ([facility]) - POCT urine pregnancy  3. Ganglion cyst of wrist, left Ibuprofen. Let me know if she decides that she'd like to proceed with specialty evaluation for excision or injection.   Fernande Bras, PA-C Physician Assistant-Certified [facility]

## 2016-02-07 NOTE — Patient Instructions (Signed)
     IF you received an x-ray today, you will receive an invoice from [facility]. Please contact [facility] at [phone] with questions or concerns regarding your invoice.   IF you received labwork today, you will receive an invoice from [facility]. Please contact [facility] at [phone] with questions or concerns regarding your invoice.   Our billing staff will not be able to assist you with questions regarding bills from these companies.  You will be contacted with the lab results as soon as they are available. The fastest way to get your results is to activate your My Chart account. Instructions are located on the last page of this paperwork. If you have not heard from us regarding the results in 2 weeks, please contact this office.      

## 2016-02-08 MED ORDER — MELOXICAM 15 MG PO TABS: 15.0000 mg | ORAL_TABLET | Freq: Every day | ORAL | Status: DC

## 2016-02-08 NOTE — Addendum Note (Signed)
Addended by: Fernande BrasJEFFERY, Monee Dembeck S on: 02/08/2016 12:29 PM   Modules accepted: Orders

## 2016-02-09 ENCOUNTER — Other Ambulatory Visit: Payer: Self-pay | Admitting: Family Medicine

## 2016-02-09 ENCOUNTER — Telehealth: Payer: Self-pay

## 2016-02-09 DIAGNOSIS — R42 Dizziness and giddiness: Secondary | ICD-10-CM

## 2016-02-09 MED ORDER — HYDROXYZINE HCL 25 MG PO TABS: 12.5000 mg | ORAL_TABLET | Freq: Three times a day (TID) | ORAL | Status: DC | PRN

## 2016-02-09 NOTE — Telephone Encounter (Signed)
Please call patient and let her know that I have sent in a prescription for vistaril to her pharmacy. Chelle will be calling her at some point to discuss her labs and how she is doing.

## 2016-02-09 NOTE — Telephone Encounter (Signed)
Patient states she is very dizzy and needs a note to return to work on Monday. Patient states that she was recently seen for vomitting and dizziness and wasn't prescribed any medication to help with dizziness. Please advise!  Patient phone: [phone] [facility] [address] in [place]

## 2016-02-10 NOTE — Telephone Encounter (Signed)
Patient notified of the message and states that she started medication yesterday and doesn't feel any better but will give it a few more days.

## 2016-03-06 ENCOUNTER — Telehealth: Payer: Self-pay

## 2016-03-06 ENCOUNTER — Other Ambulatory Visit: Payer: Self-pay | Admitting: Family Medicine

## 2016-03-06 DIAGNOSIS — R110 Nausea: Secondary | ICD-10-CM

## 2016-03-06 MED ORDER — RANITIDINE HCL 150 MG PO TABS: 150.0000 mg | ORAL_TABLET | Freq: Two times a day (BID) | ORAL | Status: DC

## 2016-03-06 NOTE — Telephone Encounter (Signed)
Ranitidine 150mg  #30 Sig: 1 tab BID.  -0-RF ordered on 6/21

## 2016-03-06 NOTE — Telephone Encounter (Signed)
Meds ordered this encounter  Medications  . ranitidine (ZANTAC) 150 MG tablet    Sig: Take 1 tablet (150 mg total) by mouth 2 (two) times daily.    Dispense:  30 tablet    Refill:  0    Order Specific Question:  Supervising Provider    Answer:  SHAW, EVA N [[id number]]    I presume that this is helping, since she's requesting a refill. How is she?

## 2016-03-08 NOTE — Telephone Encounter (Signed)
See Chelle's message on 7/19 telephone message. Called pt to get status update for Chelle and ask how well ranitidine and vistaril are working. LMOM for her to CB, then will send req to RF vistaril to Chelle along w/pt report.

## 2016-03-11 NOTE — Telephone Encounter (Signed)
Pt hasn't called back, but I see that Chelle OKd the RF for ranitidine req'd at the same time, so will also sent a Rf of this.

## 2016-03-16 ENCOUNTER — Other Ambulatory Visit: Payer: Self-pay | Admitting: Physician Assistant

## 2016-03-18 ENCOUNTER — Other Ambulatory Visit: Payer: Self-pay | Admitting: Physician Assistant

## 2016-03-18 DIAGNOSIS — R110 Nausea: Secondary | ICD-10-CM

## 2016-03-18 NOTE — Telephone Encounter (Signed)
Chelle, do you want to give RFs or have pt RTC for re-check?

## 2016-03-20 ENCOUNTER — Ambulatory Visit (INDEPENDENT_AMBULATORY_CARE_PROVIDER_SITE_OTHER): Payer: Managed Care, Other (non HMO) | Admitting: Physician Assistant

## 2016-03-20 ENCOUNTER — Encounter: Payer: Self-pay | Admitting: Physician Assistant

## 2016-03-20 VITALS — BP 110/72 | HR 100 | Temp 98.1°F | Resp 16 | Ht 61.0 in | Wt 142.4 lb

## 2016-03-20 DIAGNOSIS — Z23 Encounter for immunization: Secondary | ICD-10-CM

## 2016-03-20 DIAGNOSIS — R519 Headache, unspecified: Secondary | ICD-10-CM | POA: Insufficient documentation

## 2016-03-20 DIAGNOSIS — R51 Headache: Secondary | ICD-10-CM

## 2016-03-20 NOTE — Progress Notes (Signed)
   Gwendolyn Rice  MRN: [id number] DOB: [birth year]  PCP: [name], NP  Subjective:  Pt presents to clinic for immunization update.  She needs TDaP.    Review of Systems  Constitutional: Negative.   Respiratory: Negative.   Cardiovascular: Negative.   Gastrointestinal: Negative.   Psychiatric/Behavioral: Negative for hallucinations.    Patient Active Problem List   Diagnosis Date Noted  . Headache 03/20/2016    Priority: Medium    Class: Chronic  . Ganglion cyst of wrist 02/07/2016  . 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 drospirenone-ethinyl estradiol (YAZ,GIANVI,LORYNA) 3-0.02 MG tablet Take 1 tablet by mouth daily. 3 Package 3  . hydrOXYzine (ATARAX/VISTARIL) 25 MG tablet TAKE 0.5-1 TABLETS (12.5-25 MG TOTAL) BY MOUTH EVERY 8 (EIGHT) HOURS AS NEEDED. 30 tablet 0  . ranitidine (ZANTAC) 150 MG tablet Take 1 tablet (150 mg total) by mouth 2 (two) times daily. 30 tablet 0  . ondansetron (ZOFRAN-ODT) 8 MG disintegrating tablet Take 1 tablet (8 mg total) by mouth every 8 (eight) hours as needed for nausea. 30 tablet 0   No current facility-administered medications on file prior to visit.     Allergies  Allergen Reactions  . Amoxicillin Swelling  . Ciprofloxacin Swelling  . Penicillins Swelling  . Sulfa Antibiotics Swelling    Objective:  BP 110/72 (BP Location: Right Arm, Patient Position: Sitting, Cuff Size: Normal)   Pulse 100   Temp 98.1 F (36.7 C) (Oral)   Resp 16   Ht 5\' 1"  (1.549 m)   Wt 142 lb 6.4 oz (64.6 kg)   LMP 03/11/2016 (Approximate)   SpO2 100%   BMI 26.91 kg/m   Physical Exam  Constitutional: She is oriented to person, place, and time and well-developed, well-nourished, and in no distress.  HENT:  Head: Normocephalic and atraumatic.  Cardiovascular: Normal rate, regular rhythm and normal heart sounds.     Pulmonary/Chest: Effort normal and breath sounds normal. No respiratory distress. She has no wheezes. She has no rales.  Neurological: She is alert and oriented to person, place, and time. Gait normal. GCS score is 15.  Skin: Skin is warm and dry.  Psychiatric: Mood, memory, affect and judgment normal.  Vitals reviewed.   Assessment and Plan :  1. Need for Tdap vaccination - Tdap vaccine IM   Marco Collie, PA-C  [facility] 03/20/2016 4:29 PM

## 2016-03-20 NOTE — Progress Notes (Signed)
Gwendolyn Rice presents for immunizations.    Screening questions for immunizations: 1. Are you sick today?  no 2. Do you have allergies to medications, foods, or any vaccines?  no 3. Have you ever had a serious reaction after receiving a vaccination?  no 4. Do you have a long-term health problem with heart disease, asthma, lung disease, kidney disease, metabolic disease (e.g. diabetes), anemia, or other blood disorder?  no 5. Have you had a seizure, brain problem, or other nervous system problem?  no 6. Do you have cancer, leukemia, AIDS, or any other immune system problem?  no 7. Do you take cortisone, prednisone, other steroids, anticancer drugs or have you had radiation treatments?  no 8. Have you received a transfusion of blood or blood products, or been given immune (gamma) globulin or an antiviral drug in the past year?  no 9. Have you received vaccinations in the past 4 weeks?  no 10. FEMALES ONLY: Are you pregnant or is there a chance you could become pregnant during the next month?  no

## 2016-03-20 NOTE — Assessment & Plan Note (Signed)
Takes Ranitidine 150 mg and HydroxyzineHCL 25mg 

## 2016-03-22 ENCOUNTER — Other Ambulatory Visit: Payer: Self-pay

## 2016-03-22 DIAGNOSIS — R110 Nausea: Secondary | ICD-10-CM

## 2016-03-22 MED ORDER — RANITIDINE HCL 150 MG PO TABS: 150.0000 mg | ORAL_TABLET | Freq: Two times a day (BID) | ORAL | 0 refills | Status: DC

## 2016-03-27 ENCOUNTER — Other Ambulatory Visit: Payer: Self-pay | Admitting: Physician Assistant

## 2016-03-28 ENCOUNTER — Other Ambulatory Visit: Payer: Self-pay | Admitting: Physician Assistant

## 2016-04-20 ENCOUNTER — Ambulatory Visit (INDEPENDENT_AMBULATORY_CARE_PROVIDER_SITE_OTHER): Payer: Managed Care, Other (non HMO) | Admitting: Urgent Care

## 2016-04-20 VITALS — BP 112/78 | HR 86 | Temp 98.7°F | Resp 16 | Ht 61.0 in | Wt 141.0 lb

## 2016-04-20 DIAGNOSIS — N3090 Cystitis, unspecified without hematuria: Secondary | ICD-10-CM | POA: Diagnosis not present

## 2016-04-20 DIAGNOSIS — L299 Pruritus, unspecified: Secondary | ICD-10-CM | POA: Diagnosis not present

## 2016-04-20 DIAGNOSIS — L089 Local infection of the skin and subcutaneous tissue, unspecified: Secondary | ICD-10-CM | POA: Diagnosis not present

## 2016-04-20 DIAGNOSIS — M545 Low back pain, unspecified: Secondary | ICD-10-CM

## 2016-04-20 DIAGNOSIS — L259 Unspecified contact dermatitis, unspecified cause: Secondary | ICD-10-CM

## 2016-04-20 DIAGNOSIS — R350 Frequency of micturition: Secondary | ICD-10-CM

## 2016-04-20 LAB — POCT URINALYSIS DIP (MANUAL ENTRY)
BILIRUBIN UA: NEGATIVE
BILIRUBIN UA: NEGATIVE
GLUCOSE UA: NEGATIVE
Nitrite, UA: NEGATIVE
PH UA: 7
Protein Ur, POC: NEGATIVE
Spec Grav, UA: 1.015
Urobilinogen, UA: 0.2

## 2016-04-20 LAB — POCT CBC
Granulocyte percent: 66.3 %G (ref 37–80)
HCT, POC: 35.3 % — AB (ref 37.7–47.9)
HEMOGLOBIN: 12.6 g/dL (ref 12.2–16.2)
LYMPH, POC: 3.4 (ref 0.6–3.4)
MCH: 31.5 pg — AB (ref 27–31.2)
MCHC: 35.8 g/dL — AB (ref 31.8–35.4)
MCV: 88.1 fL (ref 80–97)
MID (cbc): 0.5 (ref 0–0.9)
MPV: 7.4 fL (ref 0–99.8)
POC GRANULOCYTE: 7.8 — AB (ref 2–6.9)
POC LYMPH PERCENT: 29.3 %L (ref 10–50)
POC MID %: 4.4 % (ref 0–12)
Platelet Count, POC: 317 10*3/uL (ref 142–424)
RBC: 4 M/uL — AB (ref 4.04–5.48)
RDW, POC: 12.7 %
WBC: 11.7 10*3/uL — AB (ref 4.6–10.2)

## 2016-04-20 LAB — POC MICROSCOPIC URINALYSIS (UMFC)

## 2016-04-20 LAB — POCT URINE PREGNANCY: Preg Test, Ur: NEGATIVE

## 2016-04-20 MED ORDER — CETIRIZINE HCL 10 MG PO TABS: 10.0000 mg | ORAL_TABLET | Freq: Every day | ORAL | 11 refills | Status: DC

## 2016-04-20 MED ORDER — NITROFURANTOIN MONOHYD MACRO 100 MG PO CAPS: 100.0000 mg | ORAL_CAPSULE | Freq: Two times a day (BID) | ORAL | 0 refills | Status: DC

## 2016-04-20 MED ORDER — CYCLOBENZAPRINE HCL 5 MG PO TABS: 5.0000 mg | ORAL_TABLET | Freq: Three times a day (TID) | ORAL | 1 refills | Status: DC | PRN

## 2016-04-20 MED ORDER — MUPIROCIN 2 % EX OINT: 1.0000 "application " | TOPICAL_OINTMENT | Freq: Three times a day (TID) | CUTANEOUS | 1 refills | Status: DC

## 2016-04-20 NOTE — Patient Instructions (Addendum)
Please hydrate with at least 2 liters of water daily. Use the antibiotic Macrobid to address urinary tract infection. I also want you to use the antibiotic cream, Bactroban, for your skin infection of your thigh and torso. Zyrtec and Ranitidine can help with itching and what I suspect is contact dermatitis (allergic skin reaction) which is likely due to either a bug bite or some other offending agent. For your back pain, continue using meloxicam but you can also use Flexeril.     IF you received an x-ray today, you will receive an invoice from [facility]. Please contact [facility] at [phone] with questions or concerns regarding your invoice.   IF you received labwork today, you will receive an invoice from [facility]. Please contact [facility] at [phone] with questions or concerns regarding your invoice.   Our billing staff will not be able to assist you with questions regarding bills from these companies.  You will be contacted with the lab results as soon as they are available. The fastest way to get your results is to activate your My Chart account. Instructions are located on the last page of this paperwork. If you have not heard from us regarding the results in 2 weeks, please contact this office.

## 2016-04-20 NOTE — Progress Notes (Signed)
MRN: [id number] DOB: April 30, 1991  Subjective:   Gwendolyn Rice is a 25 y.o. female presenting for chief complaint of Insect Bite (on back of right leg, back and arms)  Skin - Reports 1 week history of worsening rash of her right back thigh. The rash is very itchy, stings at times, has been draining slight pus. She is now having different isolated spots show up on her torso and forearms. She has been using nail polish over the areas which stings as well. Patient believes it could be chiggers. She denies fever, tick bites, cough, chest pain, red eyes, n/v, abdominal pain, contact with poisonous plants.   Back pain - Reports 1.5 month history of worsening low back pain. Pain is there every day, intermittently sharp, worse after work days. 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 Patient has been taking meloxicam for headaches since May 2017 and has not noticed any significant relief. Denies fever, trauma, falls, numbness or tingling, radiation of back pain, constipation. Does not hydrate well. Admits that she has had dysuria, urinary frequency in the past weeks, has occurred intermittently. Drinks caffeinated beverages and pees frequently.  Gwendolyn MixerBrigitte has a current medication list which includes the following prescription(s): drospirenone-ethinyl estradiol, meloxicam, ranitidine, cetirizine, and mupirocin ointment. Also is allergic to amoxicillin; ciprofloxacin; penicillins; and sulfa antibiotics.  Gwendolyn MixerBrigitte  has a past medical history of Anginal pain (HCC); HGSIL (high grade squamous intraepithelial dysplasia) (11/2012, 05/2013); and History of kidney stones. Also  has a past surgical history that includes LEEP (N/A, 07/23/2013).  Objective:   Vitals: BP 112/78   Pulse 86   Temp 98.7 F (37.1 C) (Oral)   Resp 16   Ht 5\' 1"  (1.549 m)   Wt 141 lb (64 kg)   SpO2 98%   BMI 26.64 kg/m   Physical Exam  Constitutional: She is oriented to  person, place, and time. She appears well-developed and well-nourished.  Cardiovascular: Normal rate, regular rhythm and intact distal pulses.  Exam reveals no gallop and no friction rub.   No murmur heard. Pulmonary/Chest: Effort normal. No respiratory distress. She has no wheezes. She has no rales.  Abdominal: Soft. Bowel sounds are normal. She exhibits no distension and no mass. There is no tenderness. There is no guarding.  No CVA tenderness.  Musculoskeletal:       Cervical back: She exhibits normal range of motion, no tenderness, no bony tenderness, no swelling, no edema, no deformity, no laceration and no spasm.       Thoracic back: She exhibits normal range of motion, no tenderness, no bony tenderness, no swelling, no edema, no deformity, no laceration and no spasm.       Lumbar back: She exhibits decreased range of motion (flexion, extension and lateral rotation due to pain per patient) and tenderness (over lumbar paraspinal muscles). She exhibits no bony tenderness, no swelling, no edema, no deformity, no laceration and no spasm.  Neurological: She is alert and oriented to person, place, and time.  Skin: Skin is warm and dry. Rash (most prominent over thigh as depicted, areas are pruritic and tender with slight drainage over medial rash of posterior thigh) noted. There is erythema. No pallor.   Right posterior thigh.     Results for orders placed or performed in visit on 04/20/16 (from the past 24 hour(s))  POCT urinalysis dipstick     Status: Abnormal   Collection Time: 04/20/16 12:08 PM  Result Value Ref  Range   Color, UA yellow yellow   Clarity, UA clear clear   Glucose, UA negative negative   Bilirubin, UA negative negative   Ketones, POC UA negative negative   Spec Grav, UA 1.015    Blood, UA trace-intact (A) negative   pH, UA 7.0    Protein Ur, POC negative negative   Urobilinogen, UA 0.2    Nitrite, UA Negative Negative   Leukocytes, UA moderate (2+) (A) Negative    POCT urine pregnancy     Status: Normal   Collection Time: 04/20/16 12:09 PM  Result Value Ref Range   Preg Test, Ur Negative Negative  POCT CBC     Status: Abnormal   Collection Time: 04/20/16 12:09 PM  Result Value Ref Range   WBC 11.7 (A) 4.6 - 10.2 K/uL   Lymph, poc 3.4 0.6 - 3.4   POC LYMPH PERCENT 29.3 10 - 50 %L   MID (cbc) 0.5 0 - 0.9   POC MID % 4.4 0 - 12 %M   POC Granulocyte 7.8 (A) 2 - 6.9   Granulocyte percent 66.3 37 - 80 %G   RBC 4.00 (A) 4.04 - 5.48 M/uL   Hemoglobin 12.6 12.2 - 16.2 g/dL   HCT, POC 86.5 (A) 78.4 - 47.9 %   MCV 88.1 80 - 97 fL   MCH, POC 31.5 (A) 27 - 31.2 pg   MCHC 35.8 (A) 31.8 - 35.4 g/dL   RDW, POC 69.6 %   Platelet Count, POC 317 142 - 424 K/uL   MPV 7.4 0 - 99.8 fL  POCT Microscopic Urinalysis ([facility])     Status: Abnormal   Collection Time: 04/20/16 12:13 PM  Result Value Ref Range   WBC,UR,HPF,POC Moderate (A) None WBC/hpf   RBC,UR,HPF,POC None None RBC/hpf   Bacteria Few (A) None, Too numerous to count   Mucus Present (A) Absent   Epithelial Cells, UR Per Microscopy Moderate (A) None, Too numerous to count cells/hpf   Assessment and Plan :   1. Contact dermatitis 2. Superficial skin infection 3. Itching - Will cover for superficial skin infection with Bactroban. Use Zyrtec for itching to address possible contact dermatitis. RTC in 1 week if no improvement.  4. Bilateral low back pain without sciatica - May be due to nature of work, prolonged standing, lack of hydration. Recommended drinking at least 2 liters of water daily which the patient admits is not even close to what she drinks daily. In the meantime, patient can use naproxen alternating with APAP for pain relief.   5. Urinary frequency 6. Cystitis - Will cover for urinary tract infection with Macrobid given her multiple allergies to antibiotics. Urine culture is pending. Advised much better hydration, cut back on caffeinated beverages.   Wallis Bamberg, PA-C [facility] [phone] 04/20/2016 11:55 AM

## 2016-04-21 LAB — URINE CULTURE: Organism ID, Bacteria: 10000

## 2016-04-30 ENCOUNTER — Other Ambulatory Visit: Payer: Self-pay | Admitting: Urgent Care

## 2016-04-30 DIAGNOSIS — R350 Frequency of micturition: Secondary | ICD-10-CM

## 2016-04-30 DIAGNOSIS — N3090 Cystitis, unspecified without hematuria: Secondary | ICD-10-CM

## 2016-04-30 MED ORDER — NAPROXEN SODIUM 550 MG PO TABS: 550.0000 mg | ORAL_TABLET | Freq: Two times a day (BID) | ORAL | 1 refills | Status: DC

## 2016-04-30 MED ORDER — NITROFURANTOIN MONOHYD MACRO 100 MG PO CAPS: 100.0000 mg | ORAL_CAPSULE | Freq: Two times a day (BID) | ORAL | 0 refills | Status: DC

## 2016-07-26 ENCOUNTER — Telehealth: Payer: Self-pay

## 2016-07-26 NOTE — Telephone Encounter (Signed)
Patient called because when she picked up her bc pill the name was different. It was [place].  She did not that ingredients and dose were same. I assured her that it was the same pill chemically just a different brand. She asked why they did that. I advised she might ask pharamcy. I explained that it may be as simple as the pharmacy dealing with a different retailer that is more cost effective.  She just wanted to be sure it was ok and I told her it was same.

## 2016-08-28 ENCOUNTER — Ambulatory Visit (INDEPENDENT_AMBULATORY_CARE_PROVIDER_SITE_OTHER): Payer: Managed Care, Other (non HMO) | Admitting: Physician Assistant

## 2016-08-28 ENCOUNTER — Encounter: Payer: Self-pay | Admitting: Physician Assistant

## 2016-08-28 VITALS — BP 118/76 | HR 104 | Temp 98.2°F | Resp 16 | Ht 61.0 in | Wt 142.0 lb

## 2016-08-28 DIAGNOSIS — R8299 Other abnormal findings in urine: Secondary | ICD-10-CM | POA: Diagnosis not present

## 2016-08-28 DIAGNOSIS — R350 Frequency of micturition: Secondary | ICD-10-CM | POA: Diagnosis not present

## 2016-08-28 DIAGNOSIS — R82998 Other abnormal findings in urine: Secondary | ICD-10-CM

## 2016-08-28 LAB — POCT URINE PREGNANCY: PREG TEST UR: NEGATIVE

## 2016-08-28 LAB — POC MICROSCOPIC URINALYSIS (UMFC): MUCUS RE: ABSENT

## 2016-08-28 LAB — POCT URINALYSIS DIP (MANUAL ENTRY)
BILIRUBIN UA: NEGATIVE
Bilirubin, UA: NEGATIVE
Glucose, UA: NEGATIVE
Nitrite, UA: NEGATIVE
PH UA: 5.5
PROTEIN UA: NEGATIVE
UROBILINOGEN UA: 0.2

## 2016-08-28 MED ORDER — NITROFURANTOIN MONOHYD MACRO 100 MG PO CAPS: 100.0000 mg | ORAL_CAPSULE | Freq: Two times a day (BID) | ORAL | 0 refills | Status: DC

## 2016-08-28 NOTE — Patient Instructions (Addendum)
Consider taking OTC pyridium in the event of another UTI as this can be helpful with symtpoms.     IF you received an x-ray today, you will receive an invoice from [facility]. Please contact [facility] at [phone] with questions or concerns regarding your invoice.   IF you received labwork today, you will receive an invoice from [facility]. Please contact [facility] at [phone] with questions or concerns regarding your invoice.   Our billing staff will not be able to assist you with questions regarding bills from these companies.  You will be contacted with the lab results as soon as they are available. The fastest way to get your results is to activate your My Chart account. Instructions are located on the last page of this paperwork. If you have not heard from us regarding the results in 2 weeks, please contact this office.

## 2016-08-28 NOTE — Progress Notes (Signed)
08/28/2016 4:28 PM   DOB: 09/20/1990 / MRN: [id number]  SUBJECTIVE:  Gwendolyn Rice is a 26 y.o. female presenting for dysuria and urinary frequency that started roughly 7 days ago. Associates urinary urgency.  She denies back pain and fever.   She is allergic to amoxicillin; ciprofloxacin; penicillins; and sulfa antibiotics.   She  has a past medical history of Anginal pain (HCC); HGSIL (high grade squamous intraepithelial dysplasia) (11/2012, 05/2013); and History of kidney stones.    She  reports that she has never smoked. She has never used smokeless tobacco. She reports that she drinks alcohol. She reports that she does not use drugs. She  reports that she currently engages in sexual activity and has had female partners. She reports using the following method of birth control/protection: Pill. The patient  has a past surgical history that includes LEEP (N/A, 07/23/2013).  Her family history includes Heart attack in her maternal grandmother.  Review of Systems  Constitutional: Negative for chills and fever.  Gastrointestinal: Negative for abdominal pain, nausea and vomiting.  Genitourinary: Positive for dysuria, frequency and urgency. Negative for flank pain and hematuria.  Skin: Negative for rash.    The problem list and medications were reviewed and updated by myself where necessary and exist elsewhere in the encounter.   OBJECTIVE:  BP 118/76 (BP Location: Left Arm, Patient Position: Sitting, Cuff Size: Normal)   Pulse (!) 104   Temp 98.2 F (36.8 C) (Oral)   Resp 16   Ht 5\' 1"  (1.549 m)   Wt 142 lb (64.4 kg)   LMP 08/28/2016 (Exact Date)   SpO2 99%   BMI 26.83 kg/m   Physical Exam  Constitutional: She is oriented to person, place, and time.  Cardiovascular: Normal rate and regular rhythm.   Pulmonary/Chest: Effort normal and breath sounds normal.  Musculoskeletal: Normal range of motion.  Neurological: She is alert and oriented to person, place, and time.  Skin:  Skin is warm and dry.  Vitals reviewed.   Results for orders placed or performed in visit on 08/28/16 (from the past 72 hour(s))  POCT urinalysis dipstick     Status: Abnormal   Collection Time: 08/28/16  4:16 PM  Result Value Ref Range   Color, UA colorless (A) yellow   Clarity, UA clear clear   Glucose, UA negative negative   Bilirubin, UA negative negative   Ketones, POC UA negative negative   Spec Grav, UA <=1.005    Blood, UA trace-intact (A) negative   pH, UA 5.5    Protein Ur, POC negative negative   Urobilinogen, UA 0.2    Nitrite, UA Negative Negative   Leukocytes, UA large (3+) (A) Negative  POCT urine pregnancy     Status: None   Collection Time: 08/28/16  4:16 PM  Result Value Ref Range   Preg Test, Ur Negative Negative  POCT Microscopic Urinalysis ([facility])     Status: Abnormal   Collection Time: 08/28/16  4:17 PM  Result Value Ref Range   WBC,UR,HPF,POC Moderate (A) None WBC/hpf   RBC,UR,HPF,POC None None RBC/hpf   Bacteria Few (A) None, Too numerous to count   Mucus Absent Absent   Epithelial Cells, UR Per Microscopy Few (A) None, Too numerous to count cells/hpf    No results found.  ASSESSMENT AND PLAN:  Gwendolyn MixerBrigitte was seen today for urinary frequency.  Diagnoses and all orders for this visit:  Urine frequency -     POCT Microscopic Urinalysis ([facility]) -  POCT urinalysis dipstick -     POCT urine pregnancy  Urine leukocytes increased -     nitrofurantoin, macrocrystal-monohydrate, (MACROBID) 100 MG capsule; Take 1 capsule (100 mg total) by mouth 2 (two) times daily. -     Urine culture    The patient is advised to call or return to clinic if she does not see an improvement in symptoms, or to seek the care of the closest emergency department if she worsens with the above plan.   Deliah Boston, MHS, PA-C [facility] 08/28/2016 4:28 PM

## 2016-08-30 LAB — URINE CULTURE

## 2016-10-16 ENCOUNTER — Ambulatory Visit (INDEPENDENT_AMBULATORY_CARE_PROVIDER_SITE_OTHER): Payer: Managed Care, Other (non HMO) | Admitting: Family Medicine

## 2016-10-16 ENCOUNTER — Encounter: Payer: Self-pay | Admitting: Family Medicine

## 2016-10-16 VITALS — BP 115/68 | HR 93 | Temp 97.7°F | Resp 16 | Ht 61.0 in | Wt 140.2 lb

## 2016-10-16 DIAGNOSIS — R350 Frequency of micturition: Secondary | ICD-10-CM | POA: Diagnosis not present

## 2016-10-16 DIAGNOSIS — H9193 Unspecified hearing loss, bilateral: Secondary | ICD-10-CM

## 2016-10-16 DIAGNOSIS — H6123 Impacted cerumen, bilateral: Secondary | ICD-10-CM | POA: Diagnosis not present

## 2016-10-16 LAB — POCT URINALYSIS DIP (MANUAL ENTRY)
BILIRUBIN UA: NEGATIVE
GLUCOSE UA: NEGATIVE
Ketones, POC UA: NEGATIVE
Leukocytes, UA: NEGATIVE
Nitrite, UA: NEGATIVE
Protein Ur, POC: NEGATIVE
RBC UA: NEGATIVE
UROBILINOGEN UA: 0.2
pH, UA: 6.5

## 2016-10-16 MED ORDER — CARBAMIDE PEROXIDE 6.5 % OT SOLN: 5.0000 [drp] | Freq: Two times a day (BID) | OTIC | 1 refills | Status: DC

## 2016-10-16 NOTE — Patient Instructions (Addendum)
   IF you received an x-ray today, you will receive an invoice from [facility]. Please contact [facility] at [phone] with questions or concerns regarding your invoice.   IF you received labwork today, you will receive an invoice from [facility]. Please contact [facility] at [phone] with questions or concerns regarding your invoice.   Our billing staff will not be able to assist you with questions regarding bills from these companies.  You will be contacted with the lab results as soon as they are available. The fastest way to get your results is to activate your My Chart account. Instructions are located on the last page of this paperwork. If you have not heard from us regarding the results in 2 weeks, please contact this office.     Ear Drops, Adult You have been diagnosed with a condition that requires you to put drops of medicine into your ears. Ear drops are a medicine that is placed in the ear. This sheet gives you information about how to use ear drops. Your health care provider may also give you more specific instructions. Supplies needed:  Cotton ball.  Medicine. How to put ear drops into your ear 1. Wash your hands thoroughly with soap and water. 2. Make sure your ears are clean and dry. If there is any ear wax or drainage at the outermost portion of the ear canal, wipe it out gently with a cotton-tipped applicator. 3. Warm up the medicine by holding it in the palm of your hand for a few minutes. 4. Shake the medicine if it is a suspension. 5. Use the dropper to draw up the medicine. 6. Hold the dropper above your ear canal and put the drops in the affected ear as instructed. Do not put the dropper into your ear at any time. It may help to pull the outer flap of the ear up and back while you put the drops in. Doing this will straighten out the ear canal so the medicine can get into the canal easier. 7. To make sure your ear soaks up the medicine, do  either of these things: ? Lie down with the affected ear facing up for 10 minutes. This will cause the drops to stay in the ear canal and run down and fill the canal. ? Gently put a cotton ball in your ear canal. Leave enough of the cotton ball out so it can be easily removed. Do not push the cotton ball down into your ear with a cotton-tipped swab or other instrument. You can remove the cotton ball once the medicine has been absorbed. 8. If both ears need the drops, repeat the procedure for the other ear. Your health care provider will let you know if you need to put drops in both ears. Follow these instructions at home:  Use the ear drops for as long as directed by your health care provider, even if you begin to feel better.  Always wash your hands before and after handling the ear drops.  Keep the ear drops at room temperature.  Keep all follow-up visits as told by your health care provider. This is important. Contact a health care provider if:  Your condition gets worse.  Your pain gets worse.  You notice any unusual drainage from your ear, especially if the drainage has a bad smell.  You have trouble hearing.  You have used the ear drops for the amount of time recommended by your health care provider, but your symptoms have not   improved. Get help right away if:  You experience a form of dizziness in which you feel as if the room is spinning and you feel nauseated (vertigo).  The outside of your ear becomes red or swollen.  You develop a severe headache with or without neck stiffness. Summary  Ear drops are a medicine that is placed in the ear.  Put drops in the affected ear as instructed.  Use the ear drops for as long as directed by your health care provider, even if your symptoms begin to get better.  Keep all follow-up visits as told by your health care provider. This is important. This information is not intended to replace advice given to you by your health care  provider. Make sure you discuss any questions you have with your health care provider. Document Released: 07/30/2001 Document Revised: 08/08/2016 Document Reviewed: 08/08/2016 Elsevier Interactive Patient Education  2017 [employer]vier Inc.  

## 2016-10-16 NOTE — Progress Notes (Signed)
Chief Complaint  Patient presents with  . Both ears stopped up    recently was sick; states she can barely hear   . Urinary Frequency    states she urinates all the time. started today;     HPI  URINARY SYMPTOMS Pt reports that she was treated for a UTI in January.  She reports that earlier today she started having urinary frequency She reports that she has not taken anything for this.  She has not back pain or flank pain. Patient's last menstrual period was 09/15/2016.  Decreased hearing She also reports that she has been having decreased hearing with ears that feel "stopped up" She denies fevers, chills, cough or runny nose.  She reports that she often gets her ears blocked with wax   Past Medical History:  Diagnosis Date  . Anginal pain (HCC)    since childhoood, inflammation around the chest  . 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 History of kidney stones     Current Outpatient Prescriptions  Medication Sig Dispense Refill  . ibuprofen (ADVIL,MOTRIN) 200 MG tablet Take 200 mg by mouth every 6 (six) hours as needed.     No current facility-administered medications for this visit.     Allergies:  Allergies  Allergen Reactions  . Amoxicillin Swelling  . Ciprofloxacin Swelling  . Penicillins Swelling  . Sulfa Antibiotics Swelling  . Macrobid [Nitrofurantoin Monohyd Macro]     States she gets very sick    Past Surgical History:  Procedure Laterality Date  . LEEP N/A 07/23/2013   Procedure: LOOP ELECTROSURGICAL EXCISION PROCEDURE (LEEP);  pathology HGSIL involving endocervical glands. Ectocervical and endocervical margins positive for LGSIL    Social History   Social History  . Marital status: Single    Spouse name: n/a  . Number of children: 0  . 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 Smoking status: Never Smoker  .  Smokeless tobacco: Never Used  . Alcohol use 0.0 oz/week     Comment: Occas  . Drug use: No  . Sexual activity: Yes    Partners: Male    Birth control/ protection: Pill     Comment: 1st intercourse 49 yo-5 partners   Other Topics Concern  . None   Social History Narrative   Lives with both parents.   She has a brother, 9 years older, who lives independently.    Review of Systems  HENT: Negative for tinnitus.   Eyes: Negative for blurred vision and double vision.  Neurological: Negative for dizziness and headaches.    Objective: Vitals:   10/16/16 1615  BP: 115/68  Pulse: 93  Resp: 16  Temp: 97.7 F (36.5 C)  TempSrc: Oral  SpO2: 100%  Weight: 140 lb 3.2 oz (63.6 kg)  Height: 5\' 1"  (1.549 m)    Physical Exam General: alert, oriented, in NAD Head: normocephalic, atraumatic, no sinus tenderness Eyes: EOM intact, no scleral icterus or conjunctival injection Ears: bilateral cerumen impaction Throat: no pharyngeal exudate or erythema Lymph: no posterior auricular, submental or cervical lymph adenopathy Heart: normal rate, normal sinus rhythm, no murmurs Lungs: clear to auscultation bilaterally, no wheezing Abdomen: non-distended, no flank pain or suprapubic pain  Assessment and Plan Gwendolyn Rice was seen today for both ears stopped up and urinary frequency.  Diagnoses and all orders for this visit:  Urinary frequency-  no UTI -     POCT urinalysis dipstick  Bilateral impacted cerumen Decreased hearing of both ears Improved on the right after lavage Left ear still impacted Prescribed debrox -     Ear wax removal     Gwendolyn Rice A Creta LevinStallings

## 2016-12-17 DIAGNOSIS — A749 Chlamydial infection, unspecified: Secondary | ICD-10-CM

## 2016-12-17 HISTORY — DX: Chlamydial infection, unspecified: A74.9

## 2016-12-30 ENCOUNTER — Encounter: Payer: Self-pay | Admitting: Gynecology

## 2016-12-30 ENCOUNTER — Ambulatory Visit (INDEPENDENT_AMBULATORY_CARE_PROVIDER_SITE_OTHER): Payer: Managed Care, Other (non HMO) | Admitting: Gynecology

## 2016-12-30 VITALS — BP 116/72 | Ht 61.0 in | Wt 141.0 lb

## 2016-12-30 DIAGNOSIS — Z113 Encounter for screening for infections with a predominantly sexual mode of transmission: Secondary | ICD-10-CM

## 2016-12-30 DIAGNOSIS — Z01419 Encounter for gynecological examination (general) (routine) without abnormal findings: Secondary | ICD-10-CM

## 2016-12-30 LAB — CBC WITH DIFFERENTIAL/PLATELET
BASOS PCT: 0 %
Basophils Absolute: 0 cells/uL (ref 0–200)
EOS ABS: 385 {cells}/uL (ref 15–500)
Eosinophils Relative: 5 %
HCT: 35.7 % (ref 35.0–45.0)
HEMOGLOBIN: 11.9 g/dL (ref 11.7–15.5)
LYMPHS ABS: 2310 {cells}/uL (ref 850–3900)
LYMPHS PCT: 30 %
MCH: 30.3 pg (ref 27.0–33.0)
MCHC: 33.3 g/dL (ref 32.0–36.0)
MCV: 90.8 fL (ref 80.0–100.0)
MONO ABS: 924 {cells}/uL (ref 200–950)
MPV: 9.2 fL (ref 7.5–12.5)
Monocytes Relative: 12 %
NEUTROS PCT: 53 %
Neutro Abs: 4081 cells/uL (ref 1500–7800)
Platelets: 311 10*3/uL (ref 140–400)
RBC: 3.93 MIL/uL (ref 3.80–5.10)
RDW: 13.2 % (ref 11.0–15.0)
WBC: 7.7 10*3/uL (ref 3.8–10.8)

## 2016-12-30 MED ORDER — DROSPIRENONE-ETHINYL ESTRADIOL 3-0.02 MG PO TABS: 1.0000 | ORAL_TABLET | Freq: Every day | ORAL | 12 refills | Status: DC

## 2016-12-30 NOTE — Progress Notes (Signed)
    Gwendolyn Rice August 02, 1991 [id number]        26 y.o.  G0P0 for annual exam.    Past medical history,surgical history, problem list, medications, allergies, family history and social history were all reviewed and documented as reviewed in the EPIC chart.  ROS:  Performed with pertinent positives and negatives included in the history, assessment and plan.   Additional significant findings :  None   Exam: Kennon PortelaKim Gardner assistant Vitals:   12/30/16 1557  BP: 116/72  Weight: 141 lb (64 kg)  Height: 5\' 1"  (1.549 m)   Body mass index is 26.64 kg/m.  General appearance:  Normal affect, orientation and appearance. Skin: Grossly normal HEENT: Without gross lesions.  No cervical or supraclavicular adenopathy. Thyroid normal.  Lungs:  Clear without wheezing, rales or rhonchi Cardiac: RR, without RMG Abdominal:  Soft, nontender, without masses, guarding, rebound, organomegaly or hernia Breasts:  Examined lying and sitting without masses, retractions, discharge or axillary adenopathy. Pelvic:  Ext, BUS, Vagina: Normal  Cervix: Normal with some LEEP distortion. GC/Chlamydia done  Uterus: Anteverted, normal size, shape and contour, midline and mobile nontender   Adnexa: Without masses or tenderness    Anus and perineum: Normal    Assessment/Plan:  26 y.o. G0P0 female for annual exam with regular menses, oral contraceptives.   1. Continues on Yaz equivalent doing well. Again reviewed possible slight increased risk of thrombosis. Patient understands and accepts. Refill 1 year provided. 2. 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 ECC was negative. Follow up Pap smears 2015, 2016 and 2017 all normal. No Pap smear done today. Will plan less frequent screening intervals per current screening guidelines. 3. STD screening. GC/Chlamydia done. Declined serum screening. 4. Breast health. SBE monthly reviewed. 5. Gardasil series reportedly received. 6. Health  maintenance. CBC, CMP ordered today. Patient agrees to have done. Follow up in one year, sooner as needed.   [name],[name] MD, 4:20 PM 12/30/2016

## 2016-12-30 NOTE — Patient Instructions (Signed)
Follow-up in 1 year for annual exam 

## 2016-12-31 ENCOUNTER — Encounter: Payer: Self-pay | Admitting: Gynecology

## 2016-12-31 ENCOUNTER — Other Ambulatory Visit: Payer: Self-pay | Admitting: Gynecology

## 2016-12-31 LAB — COMPREHENSIVE METABOLIC PANEL
ALK PHOS: 50 U/L (ref 33–115)
ALT: 10 U/L (ref 6–29)
AST: 15 U/L (ref 10–30)
Albumin: 3.8 g/dL (ref 3.6–5.1)
BILIRUBIN TOTAL: 0.3 mg/dL (ref 0.2–1.2)
BUN: 10 mg/dL (ref 7–25)
CO2: 22 mmol/L (ref 20–31)
Calcium: 9 mg/dL (ref 8.6–10.2)
Chloride: 105 mmol/L (ref 98–110)
Creat: 0.83 mg/dL (ref 0.50–1.10)
GLUCOSE: 94 mg/dL (ref 65–99)
Potassium: 3.9 mmol/L (ref 3.5–5.3)
Sodium: 137 mmol/L (ref 135–146)
Total Protein: 6.8 g/dL (ref 6.1–8.1)

## 2016-12-31 LAB — GC/CHLAMYDIA PROBE AMP
CT Probe RNA: DETECTED — AB
GC Probe RNA: NOT DETECTED

## 2016-12-31 MED ORDER — AZITHROMYCIN 500 MG PO TABS: 1000.0000 mg | ORAL_TABLET | Freq: Once | ORAL | 0 refills | Status: AC

## 2017-01-01 ENCOUNTER — Telehealth: Payer: Self-pay | Admitting: *Deleted

## 2017-01-01 NOTE — Telephone Encounter (Signed)
Pt was prescribed azithromycin 1 gram #2 tablet take both tablets once. Pt said she took 1 tablet yesterday and said she didn't realize she was to take both tablets once (directions are on Rx) she is going to take second tablet today. Pt asked me to relay this to you and make sure this is okay and confirm no additional medication is needed. Please advise

## 2017-01-02 NOTE — Telephone Encounter (Signed)
Should be okay. I would move her test of cure to one month

## 2017-01-02 NOTE — Telephone Encounter (Signed)
Pt informed front desk will schedule.

## 2017-01-15 ENCOUNTER — Other Ambulatory Visit: Payer: Self-pay | Admitting: Gynecology

## 2017-01-29 ENCOUNTER — Encounter: Payer: Self-pay | Admitting: Gynecology

## 2017-01-29 ENCOUNTER — Ambulatory Visit (INDEPENDENT_AMBULATORY_CARE_PROVIDER_SITE_OTHER): Payer: Managed Care, Other (non HMO) | Admitting: Gynecology

## 2017-01-29 VITALS — BP 112/76

## 2017-01-29 DIAGNOSIS — Z113 Encounter for screening for infections with a predominantly sexual mode of transmission: Secondary | ICD-10-CM

## 2017-01-29 NOTE — Progress Notes (Signed)
    Gwendolyn Rice Jan 05, 1991 [id number]        25 y.o.  G0P0 patient presents for test of cure with positive Chlamydia. Took azithromycin. No complaints at this time.  Past medical history,surgical history, problem list, medications, allergies, family history and social history were all reviewed and documented in the EPIC chart.  Directed ROS with pertinent positives and negatives documented in the history of present illness/assessment and plan.  Exam: [name] assistant Vitals:   01/29/17 1554  BP: 112/76   General appearance:  Normal Abdomen soft nontender without masses guarding rebound Pelvic external BUS vagina normal. Cervix normal. GC/committee screen done. Uterus normal size midline mobile nontender. Adnexa without masses or tenderness.  Assessment/Plan:  26 y.o. G0P0 for test of cure with recent positive Chlamydia treated. Patient will follow up for results. Assuming negative then follow up routinely when due for annual exam.    Anastasio Auerbach MD, 4:08 PM 01/29/2017

## 2017-01-29 NOTE — Addendum Note (Signed)
Addended by: Nelva Nay on: 01/29/2017 04:18 PM   Modules accepted: Orders

## 2017-01-29 NOTE — Patient Instructions (Signed)
Follow up for screening results

## 2017-01-30 LAB — GC/CHLAMYDIA PROBE AMP
CT Probe RNA: DETECTED — AB
GC Probe RNA: NOT DETECTED

## 2017-02-03 ENCOUNTER — Other Ambulatory Visit: Payer: Self-pay | Admitting: Gynecology

## 2017-02-03 MED ORDER — AZITHROMYCIN 500 MG PO TABS: 1000.0000 mg | ORAL_TABLET | Freq: Once | ORAL | 0 refills | Status: AC

## 2017-02-27 ENCOUNTER — Ambulatory Visit (INDEPENDENT_AMBULATORY_CARE_PROVIDER_SITE_OTHER): Payer: Managed Care, Other (non HMO) | Admitting: Physician Assistant

## 2017-02-27 ENCOUNTER — Encounter: Payer: Self-pay | Admitting: Physician Assistant

## 2017-02-27 VITALS — BP 120/74 | HR 97 | Temp 98.4°F | Resp 18 | Ht 61.02 in | Wt 144.8 lb

## 2017-02-27 DIAGNOSIS — R21 Rash and other nonspecific skin eruption: Secondary | ICD-10-CM | POA: Diagnosis not present

## 2017-02-27 DIAGNOSIS — L209 Atopic dermatitis, unspecified: Secondary | ICD-10-CM

## 2017-02-27 MED ORDER — TRIAMCINOLONE ACETONIDE 0.1 % EX CREA: 1.0000 "application " | TOPICAL_CREAM | Freq: Two times a day (BID) | CUTANEOUS | 0 refills | Status: DC

## 2017-02-27 NOTE — Progress Notes (Signed)
   Bayard HuggerBrigitte Rickenbach  MRN: [id number] DOB: 11-30-1990  PCP: Patient, No Pcp Per  Subjective:  Pt is a 26 year old female who presents to clinic for rash on her left side x 2 days.   C/o mild itchiness. Denies recent illness. No new medications.  She has tried an alcohol pad. Helped some. She is nervous bc her dad has shingles.   Review of Systems  Constitutional: Negative for chills, fatigue and fever.  Gastrointestinal: Negative for abdominal pain, diarrhea, nausea and vomiting.  Skin: Positive for rash.    Patient Active Problem List   Diagnosis Date Noted  . Headache 03/20/2016    Priority: Medium    Class: Chronic  . Ganglion cyst of wrist 02/07/2016  . 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 ibuprofen (ADVIL,MOTRIN) 200 MG tablet Take 200 mg by mouth every 6 (six) hours as needed.    . drospirenone-ethinyl estradiol (VESTURA) 3-0.02 MG tablet Take 1 tablet by mouth daily. (Patient not taking: Reported on 02/27/2017) 1 Package 12   No current facility-administered medications on file prior to visit.     Allergies  Allergen Reactions  . Amoxicillin Swelling  . Ciprofloxacin Swelling  . Penicillins Swelling  . Sulfa Antibiotics Swelling  . Macrobid [employer][Nitrofurantoin Monohyd Macro]     States she gets very sick     Objective:  BP 120/74 (BP Location: Right Arm, Patient Position: Sitting, Cuff Size: Normal)   Pulse 97   Temp 98.4 F (36.9 C) (Oral)   Resp 18   Ht 5' 1.02" (1.55 m)   Wt 144 lb 12.8 oz (65.7 kg)   LMP 01/20/2017 (Approximate)   SpO2 100%   BMI 27.34 kg/m   Physical Exam  Constitutional: She is oriented to person, place, and time and well-developed, well-nourished, and in no distress. No distress.  Cardiovascular: Normal rate, regular rhythm and normal heart sounds.   Neurological: She is alert and oriented to person, place, and  time. GCS score is 15.  Skin: Skin is warm and dry.     Psychiatric: Mood, memory, affect and judgment normal.  Vitals reviewed.   Assessment and Plan :  1. Rash and nonspecific skin eruption 2. Atopic dermatitis, unspecified type - triamcinolone cream (KENALOG) 0.1 %; Apply 1 application topically 2 (two) times daily.  Dispense: 30 g; Refill: 0 - Suspect uncomplicated dermatitis. Plan to treat topically. RTC in 5-7 days if no improvement.   Marco CollieWhitney Orbin Mayeux, PA-C  [facility] at [facility] 02/27/2017 4:30 PM

## 2017-02-27 NOTE — Patient Instructions (Signed)
     IF you received an x-ray today, you will receive an invoice from [facility]. Please contact [facility] at [phone] with questions or concerns regarding your invoice.   IF you received labwork today, you will receive an invoice from [facility]. Please contact [facility] at [phone] with questions or concerns regarding your invoice.   Our billing staff will not be able to assist you with questions regarding bills from these companies.  You will be contacted with the lab results as soon as they are available. The fastest way to get your results is to activate your My Chart account. Instructions are located on the last page of this paperwork. If you have not heard from us regarding the results in 2 weeks, please contact this office.     

## 2017-03-03 ENCOUNTER — Ambulatory Visit: Payer: Managed Care, Other (non HMO) | Admitting: Gynecology

## 2017-03-17 ENCOUNTER — Encounter: Payer: Self-pay | Admitting: Gynecology

## 2017-03-17 ENCOUNTER — Ambulatory Visit (INDEPENDENT_AMBULATORY_CARE_PROVIDER_SITE_OTHER): Payer: Managed Care, Other (non HMO) | Admitting: Gynecology

## 2017-03-17 VITALS — BP 116/76

## 2017-03-17 DIAGNOSIS — Z113 Encounter for screening for infections with a predominantly sexual mode of transmission: Secondary | ICD-10-CM

## 2017-03-17 NOTE — Patient Instructions (Signed)
Follow up May 2019 when due for annual exam, sooner if any issues

## 2017-03-17 NOTE — Progress Notes (Signed)
    Gwendolyn Rice 06-03-1991 [id number]        26 y.o.  G0P0 presents for test of cure. Recent positive chlamydia in May. Was treated with azithromycin. Came back early in June and was positive for chlamydia again reporting that she did take her azithromycin. She was retreated with azithromycin 1 g. Her partner was referred for treatment. She has abstained from intercourse since then.  Past medical history,surgical history, problem list, medications, allergies, family history and social history were all reviewed and documented in the EPIC chart.  Directed ROS with pertinent positives and negatives documented in the history of present illness/assessment and plan.  Exam: Sherrilyn RistKari assistant Vitals:   03/17/17 1153  BP: 116/76   General appearance:  Normal Abdomen soft nontender without masses guarding rebound Pelvic external BUS vagina normal. Cervix normal. Uterus normal size midline mobile nontender. Adnexa without masses or tenderness.  Assessment/Plan:  26 y.o. G0P0 for test of cure for chlamydia. GC/Chlamydia screen done. Exam is normal. Assuming negative then patient will follow up in May 2019 when due for annual exam, sooner as needed.    [name],Caston Coopersmith P MD, 12:05 PM 03/17/2017

## 2017-03-18 LAB — GC/CHLAMYDIA PROBE AMP
CT Probe RNA: NOT DETECTED
GC Probe RNA: NOT DETECTED

## 2017-03-31 ENCOUNTER — Ambulatory Visit: Payer: Managed Care, Other (non HMO) | Admitting: Gynecology

## 2017-05-10 ENCOUNTER — Encounter (HOSPITAL_COMMUNITY): Payer: Self-pay | Admitting: Emergency Medicine

## 2017-05-10 ENCOUNTER — Emergency Department (HOSPITAL_COMMUNITY)
Admission: EM | Admit: 2017-05-10 | Discharge: 2017-05-10 | Disposition: A | Discharge status: Home / Self Care | Payer: Managed Care, Other (non HMO) | Attending: Emergency Medicine | Admitting: Emergency Medicine

## 2017-05-10 DIAGNOSIS — N946 Dysmenorrhea, unspecified: Secondary | ICD-10-CM

## 2017-05-10 DIAGNOSIS — Z79899 Other long term (current) drug therapy: Secondary | ICD-10-CM | POA: Insufficient documentation

## 2017-05-10 DIAGNOSIS — N760 Acute vaginitis: Secondary | ICD-10-CM | POA: Diagnosis not present

## 2017-05-10 DIAGNOSIS — R1030 Lower abdominal pain, unspecified: Secondary | ICD-10-CM | POA: Diagnosis present

## 2017-05-10 DIAGNOSIS — B9689 Other specified bacterial agents as the cause of diseases classified elsewhere: Secondary | ICD-10-CM | POA: Insufficient documentation

## 2017-05-10 LAB — CBC WITH DIFFERENTIAL/PLATELET
Basophils Absolute: 0 10*3/uL (ref 0.0–0.1)
Basophils Relative: 0 %
Eosinophils Absolute: 0.7 10*3/uL (ref 0.0–0.7)
Eosinophils Relative: 7 %
HCT: 37.1 % (ref 36.0–46.0)
HEMOGLOBIN: 13 g/dL (ref 12.0–15.0)
LYMPHS ABS: 2.7 10*3/uL (ref 0.7–4.0)
LYMPHS PCT: 30 %
MCH: 31.6 pg (ref 26.0–34.0)
MCHC: 35 g/dL (ref 30.0–36.0)
MCV: 90.3 fL (ref 78.0–100.0)
MONOS PCT: 9 %
Monocytes Absolute: 0.8 10*3/uL (ref 0.1–1.0)
Neutro Abs: 4.8 10*3/uL (ref 1.7–7.7)
Neutrophils Relative %: 54 %
PLATELETS: 359 10*3/uL (ref 150–400)
RBC: 4.11 MIL/uL (ref 3.87–5.11)
RDW: 12.5 % (ref 11.5–15.5)
WBC: 8.9 10*3/uL (ref 4.0–10.5)

## 2017-05-10 LAB — COMPREHENSIVE METABOLIC PANEL
ALK PHOS: 60 U/L (ref 38–126)
ALT: 15 U/L (ref 14–54)
ANION GAP: 9 (ref 5–15)
AST: 17 U/L (ref 15–41)
Albumin: 3.9 g/dL (ref 3.5–5.0)
BUN: 13 mg/dL (ref 6–20)
CALCIUM: 9.4 mg/dL (ref 8.9–10.3)
CO2: 24 mmol/L (ref 22–32)
CREATININE: 0.82 mg/dL (ref 0.44–1.00)
Chloride: 106 mmol/L (ref 101–111)
Glucose, Bld: 102 mg/dL — ABNORMAL HIGH (ref 65–99)
Potassium: 3.4 mmol/L — ABNORMAL LOW (ref 3.5–5.1)
Sodium: 139 mmol/L (ref 135–145)
TOTAL PROTEIN: 7.9 g/dL (ref 6.5–8.1)
Total Bilirubin: 0.5 mg/dL (ref 0.3–1.2)

## 2017-05-10 LAB — WET PREP, GENITAL
SPERM: NONE SEEN
Trich, Wet Prep: NONE SEEN

## 2017-05-10 LAB — URINALYSIS, ROUTINE W REFLEX MICROSCOPIC
BILIRUBIN URINE: NEGATIVE
Bacteria, UA: NONE SEEN
GLUCOSE, UA: NEGATIVE mg/dL
Ketones, ur: 5 mg/dL — AB
Nitrite: NEGATIVE
PH: 6 (ref 5.0–8.0)
Protein, ur: NEGATIVE mg/dL
Specific Gravity, Urine: 1.009 (ref 1.005–1.030)

## 2017-05-10 LAB — PREGNANCY, URINE: Preg Test, Ur: NEGATIVE

## 2017-05-10 MED ORDER — KETOROLAC TROMETHAMINE 30 MG/ML IJ SOLN
30.0000 mg | Freq: Once | INTRAMUSCULAR | Status: AC
  Administered 2017-05-10: 30 mg via INTRAVENOUS
  Filled 2017-05-10: qty 1

## 2017-05-10 MED ORDER — SODIUM CHLORIDE 0.9 % IV BOLUS (SEPSIS)
1000.0000 mL | Freq: Once | INTRAVENOUS | Status: AC
  Administered 2017-05-10: 1000 mL via INTRAVENOUS

## 2017-05-10 MED ORDER — HYDROCODONE-ACETAMINOPHEN 5-325 MG PO TABS: 1.0000 | ORAL_TABLET | ORAL | 0 refills | Status: DC | PRN

## 2017-05-10 MED ORDER — METRONIDAZOLE 500 MG PO TABS
500.0000 mg | ORAL_TABLET | Freq: Two times a day (BID) | ORAL | 0 refills | Status: DC
Start: 2017-05-10 — End: 2017-06-09

## 2017-05-10 NOTE — Discharge Instructions (Signed)
You have an infection called bacterial vaginosis. Prescription for antibiotic and pain medicine.  Follow up with your ob gyn.

## 2017-05-10 NOTE — ED Triage Notes (Signed)
Pt reports lower abd pain with nausea for the past week.

## 2017-05-11 NOTE — ED Provider Notes (Signed)
[facility]EM[facility]Y DEPT Provider Note   CSN: [id number] Arrival date & time: 05/10/17  1012     History   Chief Complaint Chief Complaint  Patient presents with  . Pelvic Pain    HPI Gwendolyn Rice is a 26 y.o. female.  Bilateral lower abdominal pain for 1 week. Last menstrual period is now. She is sexually active with birth control pills, but no condoms. No vaginal discharge or urinary complaints. Past medical history includes chlamydia and high-grade squamous intraepithelial dysplasia. No fever, sweats, chills, dysuria, flank pain.      Past Medical History:  Diagnosis Date  . Anginal pain (HCC)    since childhoood, inflammation around the chest  . Chlamydia 12/2016  . 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 History of kidney stones     Patient Active Problem List   Diagnosis Date Noted  . Headache 03/20/2016    Class: Chronic  . Ganglion cyst of wrist 02/07/2016  . 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 LEEP N/A 07/23/2013   Procedure: LOOP ELECTROSURGICAL EXCISION PROCEDURE (LEEP);  pathology HGSIL involving endocervical glands. Ectocervical and endocervical margins positive for LGSIL    OB History    Gravida Para Term Preterm AB Living   0             SAB TAB Ectopic Multiple Live Births                   Home Medications    Prior to Admission medications   Medication Sig Start Date End Date Taking? Authorizing Provider  drospirenone-ethinyl estradiol (GIANVI) 3-0.02 MG tablet Take 1 tablet by mouth daily.   Yes [provider]  ibuprofen (ADVIL,MOTRIN) 200 MG tablet Take 200 mg by mouth every 6 (six) hours as needed.   Yes [provider]  HYDROcodone-acetaminophen (NORCO/VICODIN) 5-325 MG tablet Take 1 tablet by mouth every 4 (four) hours as needed. 05/10/17   Donnetta Hutching, MD  metroNIDAZOLE (FLAGYL)  500 MG tablet Take 1 tablet (500 mg total) by mouth 2 (two) times daily. 05/10/17   Donnetta Hutching, MD    Family History Family History  Problem Relation Age of Onset  . Heart attack Maternal Grandmother     Social History Social History  Substance Use Topics  . Smoking status: Never Smoker  . Smokeless tobacco: Never Used  . Alcohol use 0.6 oz/week    1 Cans of beer per week     Comment: sometimes daily     Allergies   Amoxicillin; Ciprofloxacin; Penicillins; Sulfa antibiotics; and Macrobid [nitrofurantoin monohyd macro]   Review of Systems Review of Systems  All other systems reviewed and are negative.    Physical Exam Updated Vital Signs BP 109/84 (BP Location: Left Arm)   Pulse (!) 107   Temp 98.6 F (37 C) (Oral)   Resp 18   Ht  (1.549 m)   Wt 61.2 kg (135 lb)   LMP 05/05/2017   SpO2 98%   BMI 25.51 kg/m   Physical Exam  Constitutional: She is oriented to person, place, and time. She appears well-developed and well-nourished.  HENT:  Head: Normocephalic and atraumatic.  Eyes: Conjunctivae are normal.  Neck: Neck supple.  Cardiovascular: Normal rate and regular rhythm.   Pulmonary/Chest: Effort normal and breath sounds normal.  Abdominal:  Minimal lower abdominal tenderness  Genitourinary:  Genitourinary Comments: Pelvic exam: Normal  external genitalia. There is some blood in the vaginal canal. Cervix is nontender. No obvious discharge  Musculoskeletal: Normal range of motion.  Neurological: She is alert and oriented to person, place, and time.  Skin: Skin is warm and dry.  Psychiatric: She has a normal mood and affect. Her behavior is normal.  Nursing note and vitals reviewed.    ED Treatments / Results  Labs (all labs ordered are listed, but only abnormal results are displayed) Labs Reviewed  WET PREP, GENITAL - Abnormal; Notable for the following:       Result Value   Yeast Wet Prep HPF POC PRESENT (*)    Clue Cells Wet Prep HPF POC  PRESENT (*)    WBC, Wet Prep HPF POC MODERATE (*)    All other components within normal limits  COMPREHENSIVE METABOLIC PANEL - Abnormal; Notable for the following:    Potassium 3.4 (*)    Glucose, Bld 102 (*)    All other components within normal limits  URINALYSIS, ROUTINE W REFLEX MICROSCOPIC - Abnormal; Notable for the following:    APPearance HAZY (*)    Hgb urine dipstick MODERATE (*)    Ketones, ur 5 (*)    Leukocytes, UA TRACE (*)    Squamous Epithelial / LPF 6-30 (*)    All other components within normal limits  CBC WITH DIFFERENTIAL/PLATELET  PREGNANCY, URINE  GC/CHLAMYDIA PROBE AMP ([place]) NOT AT [facility]    EKG  EKG Interpretation None       Radiology No results found.  Procedures Procedures (including critical care time)  Medications Ordered in ED Medications  sodium chloride 0.9 % bolus 1,000 mL (0 mLs Intravenous Stopped 05/10/17 1335)  ketorolac (TORADOL) 30 MG/ML injection 30 mg (30 mg Intravenous Given 05/10/17 1155)     Initial Impression / Assessment and Plan / ED Course  I have reviewed the triage vital signs and the nursing notes.  Pertinent labs & imaging results that were available during my care of the patient were reviewed by me and considered in my medical decision making (see chart for details).     No acute abdomen on physical exam. White count and hemoglobin normal. Pregnancy test negative. Tests were reviewed with the patient and her friend. Wet prep shows clue cells. Will Rx for bacterial vaginosis. Discharge medications Flagyl 500 mg and Vicodin.  Final Clinical Impressions(s) / ED Diagnoses   Final diagnoses:  BV (bacterial vaginosis)  Dysmenorrhea    New Prescriptions Discharge Medication List as of 05/10/2017  2:34 PM    START taking these medications   Details  HYDROcodone-acetaminophen (NORCO/VICODIN) 5-325 MG tablet Take 1 tablet by mouth every 4 (four) hours as needed., Starting Sat 05/10/2017, Print    metroNIDAZOLE  (FLAGYL) 500 MG tablet Take 1 tablet (500 mg total) by mouth 2 (two) times daily., Starting Sat 05/10/2017, Print         Donnetta Hutching, MD 05/11/17 [phone]

## 2017-05-12 ENCOUNTER — Telehealth: Payer: Self-pay | Admitting: *Deleted

## 2017-05-12 LAB — GC/CHLAMYDIA PROBE AMP (~~LOC~~) NOT AT ARMC
CHLAMYDIA, DNA PROBE: POSITIVE — AB
Neisseria Gonorrhea: NEGATIVE

## 2017-05-12 NOTE — Telephone Encounter (Signed)
Patient called requesting to switch to depo provera and stop generic yaz, states pill are making her sick, stomach discomfort.  had ER visit over weekend. Please advise

## 2017-05-13 NOTE — Telephone Encounter (Signed)
She used Depo-Provera in the past? Side effects including irregular bleeding. Some patients will have weight gain with it also. If she is interested in using this then she would have to come in during a menses. 150 mg every 3 months refill through 12/2017

## 2017-05-13 NOTE — Telephone Encounter (Signed)
Pt told me to call back at 3:00pm

## 2017-05-14 NOTE — Telephone Encounter (Signed)
Pt informed will think about it and call me back if she wants Rx sent.

## 2017-05-26 MED ORDER — MEDROXYPROGESTERONE ACETATE 150 MG/ML IM SUSP: 150.0000 mg | Freq: Once | INTRAMUSCULAR | 1 refills | Status: DC

## 2017-05-26 NOTE — Telephone Encounter (Signed)
Patient called back and said she would like to have Rx, pt aware to call office once menses start to schedule nurse appointment.

## 2017-05-26 NOTE — Addendum Note (Signed)
Addended by: Thamas Jaegers on: 05/26/2017 03:40 PM   Modules accepted: Orders

## 2017-06-03 ENCOUNTER — Ambulatory Visit (INDEPENDENT_AMBULATORY_CARE_PROVIDER_SITE_OTHER): Payer: Managed Care, Other (non HMO) | Admitting: Gynecology

## 2017-06-03 DIAGNOSIS — Z3042 Encounter for surveillance of injectable contraceptive: Secondary | ICD-10-CM | POA: Diagnosis not present

## 2017-06-03 MED ORDER — MEDROXYPROGESTERONE ACETATE 150 MG/ML IM SUSP
150.0000 mg | Freq: Once | INTRAMUSCULAR | Status: AC
  Administered 2017-06-03: 150 mg via INTRAMUSCULAR

## 2017-06-09 ENCOUNTER — Encounter: Payer: Self-pay | Admitting: Gynecology

## 2017-06-09 ENCOUNTER — Ambulatory Visit (INDEPENDENT_AMBULATORY_CARE_PROVIDER_SITE_OTHER): Payer: Managed Care, Other (non HMO) | Admitting: Gynecology

## 2017-06-09 VITALS — BP 118/74

## 2017-06-09 DIAGNOSIS — A749 Chlamydial infection, unspecified: Secondary | ICD-10-CM

## 2017-06-09 NOTE — Progress Notes (Signed)
    Gwendolyn Rice 09/16/1990 [id number]        [age] y.o.  G0P0 presents for test of cure for positive chlamydia that was performed 05/10/2017 through the emergency department and is here for test of cure.  Past medical history,surgical history, problem list, medications, allergies, family history and social history were all reviewed and documented in the EPIC chart.  Directed ROS with pertinent positives and negatives documented in the history of present illness/assessment and plan.  Exam: Gwendolyn PortelaKim Rice assistant Vitals:   06/09/17 1553  BP: 118/74   General appearance:  Normal Abdomen soft nontender without masses guarding rebound Pelvic external BUS vagina normal. Cervix normal. GC/chlamydia screen done. Uterus normal size midline mobile nontender. Adnexa without masses or tenderness.  Assessment/Plan:  26 y.o. G0P0 for test of cure positive chlamydia previously. GC/committee done. Patient will follow up for results.    Gwendolyn Rice P MD, 4:13 PM 06/09/2017

## 2017-06-09 NOTE — Addendum Note (Signed)
Addended by: Nelva Nay on: 06/09/2017 04:21 PM   Modules accepted: Orders

## 2017-06-09 NOTE — Patient Instructions (Signed)
follow up for annual exam in May 2019 when due. Sooner if any issues.

## 2017-06-10 LAB — C. TRACHOMATIS/N. GONORRHOEAE RNA
C. trachomatis RNA, TMA: DETECTED — AB
N. gonorrhoeae RNA, TMA: NOT DETECTED

## 2017-06-11 ENCOUNTER — Other Ambulatory Visit: Payer: Self-pay | Admitting: Gynecology

## 2017-06-11 ENCOUNTER — Encounter: Payer: Self-pay | Admitting: Gynecology

## 2017-06-11 MED ORDER — AZITHROMYCIN 500 MG PO TABS: 1000.0000 mg | ORAL_TABLET | Freq: Once | ORAL | 0 refills | Status: AC

## 2017-06-12 ENCOUNTER — Telehealth: Payer: Self-pay

## 2017-06-12 NOTE — Telephone Encounter (Signed)
Main thing is that she did not throw up the medication.  Otherwise sounds good

## 2017-06-12 NOTE — Telephone Encounter (Addendum)
Patient called in voice mail and said she took the Azithromycin today at lunch with a sandwich and it made her stomach hurt and having diarrhea.  I called her back and explained that it is a large dose of medication at once and can irritate stomach. Can try Tums and Imodium for diarrhea if she needs to.  Encouraged her to push fluids and eat something if she can.  Pharmacy had told her to check with her provider.  Dr. Nolon Stalls advice sound ok?

## 2017-08-04 ENCOUNTER — Encounter: Payer: Self-pay | Admitting: Gynecology

## 2017-08-04 ENCOUNTER — Ambulatory Visit: Payer: Managed Care, Other (non HMO) | Admitting: Gynecology

## 2017-08-04 VITALS — BP 120/76

## 2017-08-04 DIAGNOSIS — A749 Chlamydial infection, unspecified: Secondary | ICD-10-CM | POA: Diagnosis not present

## 2017-08-04 NOTE — Progress Notes (Signed)
    Gwendolyn Rice 11-19-1990 [id number]        26 y.o.  G0P0 presents for test of cure from positive chlamydia end of October.  Took azithromycin.  Reports partner treated.  Past medical history,surgical history, problem list, medications, allergies, family history and social history were all reviewed and documented in the EPIC chart.  Directed ROS with pertinent positives and negatives documented in the history of present illness/assessment and plan.  Exam: Kennon PortelaKim Gardner assistant Vitals:   08/04/17 1554  BP: 120/76   General appearance:  Normal Abdomen soft nontender without masses guarding rebound Pelvic external BUS vagina with slight vaginal staining.  Cervix normal.  Uterus normal size midline mobile nontender.  Adnexa without masses or tenderness.  Assessment/Plan:  26 y.o. G0P0 with test of cure after treatment for chlamydia done today.  Patient will follow-up for results.    Dara Lordsimothy P Raevyn Sokol MD, 4:04 PM 08/04/2017

## 2017-08-04 NOTE — Patient Instructions (Signed)
Office will call you with test results 

## 2017-08-04 NOTE — Addendum Note (Signed)
Addended by: Dayna BarkerGARDNER, Jadesola Poynter K on: 08/04/2017 04:10 PM   Modules accepted: Orders

## 2017-08-05 ENCOUNTER — Encounter: Payer: Self-pay | Admitting: Gynecology

## 2017-08-05 LAB — C. TRACHOMATIS/N. GONORRHOEAE RNA
C. trachomatis RNA, TMA: NOT DETECTED
N. GONORRHOEAE RNA, TMA: NOT DETECTED

## 2017-08-27 ENCOUNTER — Ambulatory Visit (INDEPENDENT_AMBULATORY_CARE_PROVIDER_SITE_OTHER): Payer: Managed Care, Other (non HMO) | Admitting: Gynecology

## 2017-08-27 DIAGNOSIS — Z3042 Encounter for surveillance of injectable contraceptive: Secondary | ICD-10-CM

## 2017-08-27 MED ORDER — MEDROXYPROGESTERONE ACETATE 150 MG/ML IM SUSP
150.0000 mg | Freq: Once | INTRAMUSCULAR | Status: AC
  Administered 2017-08-27: 150 mg via INTRAMUSCULAR

## 2017-11-12 ENCOUNTER — Telehealth: Payer: Self-pay | Admitting: *Deleted

## 2017-11-12 MED ORDER — MEDROXYPROGESTERONE ACETATE 150 MG/ML IM SUSP: 150.0000 mg | Freq: Once | INTRAMUSCULAR | 0 refills | Status: DC

## 2017-11-12 NOTE — Telephone Encounter (Signed)
Patient called requesting refill on depo provera, Rx sent. Pt coming of injection.

## 2017-11-13 ENCOUNTER — Ambulatory Visit (INDEPENDENT_AMBULATORY_CARE_PROVIDER_SITE_OTHER): Payer: Managed Care, Other (non HMO) | Admitting: Gynecology

## 2017-11-13 DIAGNOSIS — Z3042 Encounter for surveillance of injectable contraceptive: Secondary | ICD-10-CM

## 2017-11-13 MED ORDER — MEDROXYPROGESTERONE ACETATE 150 MG/ML IM SUSP
150.0000 mg | Freq: Once | INTRAMUSCULAR | Status: AC
  Administered 2017-11-13: 150 mg via INTRAMUSCULAR

## 2017-12-10 ENCOUNTER — Ambulatory Visit: Payer: Managed Care, Other (non HMO) | Admitting: Physician Assistant

## 2017-12-10 ENCOUNTER — Telehealth: Payer: Self-pay | Admitting: *Deleted

## 2017-12-10 ENCOUNTER — Encounter: Payer: Self-pay | Admitting: Physician Assistant

## 2017-12-10 ENCOUNTER — Other Ambulatory Visit: Payer: Self-pay

## 2017-12-10 VITALS — BP 122/64 | HR 103 | Temp 98.1°F | Resp 18 | Ht 61.0 in | Wt 142.4 lb

## 2017-12-10 DIAGNOSIS — R1030 Lower abdominal pain, unspecified: Secondary | ICD-10-CM | POA: Diagnosis not present

## 2017-12-10 DIAGNOSIS — R1084 Generalized abdominal pain: Secondary | ICD-10-CM

## 2017-12-10 LAB — POCT CBC
Granulocyte percent: 58.6 %G (ref 37–80)
HCT, POC: 37.5 % — AB (ref 37.7–47.9)
HEMOGLOBIN: 12.4 g/dL (ref 12.2–16.2)
LYMPH, POC: 2.9 (ref 0.6–3.4)
MCH, POC: 28.8 pg (ref 27–31.2)
MCHC: 33.1 g/dL (ref 31.8–35.4)
MCV: 87.2 fL (ref 80–97)
MID (cbc): 1.2 — AB (ref 0–0.9)
MPV: 6.7 fL (ref 0–99.8)
PLATELET COUNT, POC: 454 10*3/uL — AB (ref 142–424)
POC GRANULOCYTE: 5.8 (ref 2–6.9)
POC LYMPH %: 29.5 % (ref 10–50)
POC MID %: 11.9 %M (ref 0–12)
RBC: 4.3 M/uL (ref 4.04–5.48)
RDW, POC: 13.2 %
WBC: 9.9 10*3/uL (ref 4.6–10.2)

## 2017-12-10 LAB — POCT URINALYSIS DIP (MANUAL ENTRY)
Bilirubin, UA: NEGATIVE
Blood, UA: NEGATIVE
Glucose, UA: NEGATIVE mg/dL
NITRITE UA: NEGATIVE
PH UA: 6 (ref 5.0–8.0)
PROTEIN UA: NEGATIVE mg/dL
Spec Grav, UA: >=1.03 — AB (ref 1.010–1.025)
UROBILINOGEN UA: 0.2 U/dL

## 2017-12-10 LAB — POCT URINE PREGNANCY: PREG TEST UR: NEGATIVE

## 2017-12-10 MED ORDER — DICYCLOMINE HCL 10 MG PO CAPS: 10.0000 mg | ORAL_CAPSULE | Freq: Three times a day (TID) | ORAL | 0 refills | Status: DC

## 2017-12-10 MED ORDER — TRAMADOL HCL 50 MG PO TABS: 25.0000 mg | ORAL_TABLET | Freq: Four times a day (QID) | ORAL | 0 refills | Status: DC | PRN

## 2017-12-10 NOTE — Telephone Encounter (Signed)
Pt called c/o loose stool, and stomach sharp pain, noticed blood in urine as well, uses depo provera, no burning with urination, no frequent urination. I explained Dr.Fointaine is out of the office and I recommend to go by urgent care for evaluation, pt is not on cycle due to depo provera, pt verbalized she understood.

## 2017-12-10 NOTE — Patient Instructions (Addendum)
Your labs are all coming back largely normal right now.  Your vital signs and weight are on par with previous visits.  I would like for Gwendolyn Rice to complete your work-up and I want to see you back in about 1 week for recheck of this problem.  Please stop taking ibuprofen.  I have given you some options for pain today.  Please take them as prescribed.  Bland Diet A bland diet consists of foods that do not have a lot of fat or fiber. Foods without fat or fiber are easier for the body to digest. They are also less likely to irritate your mouth, throat, stomach, and other parts of your gastrointestinal tract. A bland diet is sometimes called a BRAT diet. What is my plan? Your health care provider or dietitian may recommend specific changes to your diet to prevent and treat your symptoms, such as:  Eating small meals often.  Cooking food until it is soft enough to chew easily.  Chewing your food well.  Drinking fluids slowly.  Not eating foods that are very spicy, sour, or fatty.  Not eating citrus fruits, such as oranges and grapefruit.  What do I need to know about this diet?  Eat a variety of foods from the bland diet food list.  Do not follow a bland diet longer than you have to.  Ask your health care provider whether you should take vitamins. What foods can I eat? Grains  Hot cereals, such as cream of wheat. Bread, crackers, or tortillas made from refined white flour. Rice. Vegetables Canned or cooked vegetables. Mashed or boiled potatoes. Fruits Bananas. Applesauce. Other types of cooked or canned fruit with the skin and seeds removed, such as canned peaches or pears. Meats and Other Protein Sources Scrambled eggs. Creamy peanut butter or other nut butters. Lean, well-cooked meats, such as chicken or fish. Tofu. Soups or broths. Dairy Low-fat dairy products, such as milk, cottage cheese, or yogurt. Beverages Water. Herbal tea. Apple juice. Sweets and Desserts Pudding. Custard.  Fruit gelatin. Ice cream. Fats and Oils Mild salad dressings. Canola or olive oil. The items listed above may not be a complete list of allowed foods or beverages. Contact your dietitian for more options. What foods are not recommended? Foods and ingredients that are often not recommended include:  Spicy foods, such as hot sauce or salsa.  Fried foods.  Sour foods, such as pickled or fermented foods.  Raw vegetables or fruits, especially citrus or berries.  Caffeinated drinks.  Alcohol.  Strongly flavored seasonings or condiments.  The items listed above may not be a complete list of foods and beverages that are not allowed. Contact your dietitian for more information. This information is not intended to replace advice given to you by your health care provider. Make sure you discuss any questions you have with your health care provider. Document Released: 11/27/2015 Document Revised: 01/11/2016 Document Reviewed: 08/17/2014 Elsevier Interactive Patient Education  2018 [employer] .  IF you received an x-ray today, you will receive an invoice from [facility]. Please contact [facility] at [phone] with questions or concerns regarding your invoice.   IF you received labwork today, you will receive an invoice from [facility]. Please contact [facility] at [phone] with questions or concerns regarding your invoice.   Our billing staff will not be able to assist you with questions regarding bills from these companies.  You will be contacted with the lab results as soon as they are available. The fastest way  to get your results is to activate your My Chart account. Instructions are located on the last page of this paperwork. If you have not heard from [place] regarding the results in 2 weeks, please contact this office.

## 2017-12-10 NOTE — Progress Notes (Signed)
12/10/2017 5:23 PM   DOB: 06-26-1991 / MRN: [id number]  SUBJECTIVE:  Gwendolyn Rice is a 27 y.o. female presenting for a 3-week history of bloody  diarrhea.  Her symptoms wax and wane and often she will have lower abdominal pain.  She denies any dysuria, urgency, frequency, vaginal discharge.  She denies any significant diet changes in the last 6 to 8 weeks.  The only medication she is currently taking is Depo-Provera.  She denies fever.  She has been taking ibuprofen for the pain sometimes 1 to 3 tablets daily.  She has no known family history of celiac, Crohn, ulcerative colitis.  She does not feel the symptoms are getting worse or better.  She is allergic to amoxicillin; ciprofloxacin; penicillins; sulfa antibiotics; flagyl [metronidazole]; and macrobid [nitrofurantoin monohyd macro].   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    She  reports that she has never smoked. She has never used smokeless tobacco. She reports that she drinks about 0.6 oz of alcohol per week. She reports that she does not use drugs. She  reports that she currently engages in sexual activity and has had partners who are Female. She reports using the following method of birth control/protection: Pill. The patient  has a past surgical history that includes LEEP (N/A, 07/23/2013).  Her family history includes Heart attack in her maternal grandmother.  Review of Systems  Constitutional: Positive for malaise/fatigue. Negative for chills, diaphoresis, fever and weight loss.  Respiratory: Negative for cough.   Gastrointestinal: Positive for abdominal pain, blood in stool and diarrhea. Negative for constipation, heartburn, melena, nausea and vomiting.  Skin: Negative for rash.  Neurological: Negative for dizziness.    The problem list and medications were reviewed and updated  by myself where necessary and exist elsewhere in the encounter.   OBJECTIVE:  BP 122/64   Pulse (!) 103   Temp 98.1 F (36.7 C) (Oral)   Resp 18   Ht 5\' 1"  (1.549 m)   Wt 142 lb 6.4 oz (64.6 kg)   SpO2 100%   BMI 26.91 kg/m   Wt Readings from Last 3 Encounters:  12/10/17 142 lb 6.4 oz (64.6 kg)  05/10/17 135 lb (61.2 kg)  02/27/17 144 lb 12.8 oz (65.7 kg)   Temp Readings from Last 3 Encounters:  12/10/17 98.1 F (36.7 C) (Oral)  05/10/17 98.6 F (37 C) (Oral)  02/27/17 98.4 F (36.9 C) (Oral)   BP Readings from Last 3 Encounters:  12/10/17 122/64  08/04/17 120/76  06/09/17 118/74   Pulse Readings from Last 3 Encounters:  12/10/17 (!) 103  05/10/17 (!) 107  02/27/17 97      Physical Exam  Constitutional: She is oriented to person, place, and time. She appears well-developed and well-nourished.  Non-toxic appearance. No distress.  Eyes: Pupils are equal, round, and reactive to light. EOM are normal.  Cardiovascular: Normal rate, regular rhythm, S1 normal, S2 normal, normal heart sounds and intact distal pulses. Exam reveals no gallop, no friction rub and no decreased pulses.  No murmur heard. Pulmonary/Chest: Effort normal. No stridor. No respiratory distress. She has no wheezes. She has no rales.  Abdominal: Soft. Normal appearance and bowel sounds are normal. She exhibits no distension, no pulsatile midline mass and no mass. There is no tenderness. There is no rigidity. No hernia.  Musculoskeletal: She exhibits no edema.  Neurological:  She is alert and oriented to person, place, and time. No cranial nerve deficit. Gait normal.  Skin: Skin is dry. No rash noted. She is not diaphoretic. No cyanosis or erythema. There is pallor.  Psychiatric: She has a normal mood and affect.  Vitals reviewed.   Results for orders placed or performed in visit on 12/10/17 (from the past 72 hour(s))  POCT CBC     Status: Abnormal   Collection Time: 12/10/17  5:04 PM  Result Value  Ref Range   WBC 9.9 4.6 - 10.2 K/uL   Lymph, poc 2.9 0.6 - 3.4   POC LYMPH PERCENT 29.5 10 - 50 %L   MID (cbc) 1.2 (A) 0 - 0.9   POC MID % 11.9 0 - 12 %M   POC Granulocyte 5.8 2 - 6.9   Granulocyte percent 58.6 37 - 80 %G   RBC 4.30 4.04 - 5.48 M/uL   Hemoglobin 12.4 12.2 - 16.2 g/dL   HCT, POC 16.137.5 (A) 09.637.7 - 47.9 %   MCV 87.2 80 - 97 fL   MCH, POC 28.8 27 - 31.2 pg   MCHC 33.1 31.8 - 35.4 g/dL   RDW, POC 04.513.2 %   Platelet Count, POC 454 (A) 142 - 424 K/uL   MPV 6.7 0 - 99.8 fL  POCT urinalysis dipstick     Status: Abnormal   Collection Time: 12/10/17  5:21 PM  Result Value Ref Range   Color, UA yellow yellow   Clarity, UA cloudy (A) clear   Glucose, UA negative negative mg/dL   Bilirubin, UA negative negative   Ketones, POC UA trace (5) (A) negative mg/dL   Spec Grav, UA >=4.098>=1.030 (A) 1.010 - 1.025   Blood, UA negative negative   pH, UA 6.0 5.0 - 8.0   Protein Ur, POC negative negative mg/dL   Urobilinogen, UA 0.2 0.2 or 1.0 E.U./dL   Nitrite, UA Negative Negative   Leukocytes, UA Trace (A) Negative    No results found.  ASSESSMENT AND PLAN:  Vesta MixerBrigitte was seen today for abdominal pain and diarrhea.  Diagnoses and all orders for this visit:  Lower abdominal pain: Checking for infectious versus autoimmune causes.  Fortunately vitals are stable today and her abdominal exam is nontender.  I think it is important that she stop taking the ibuprofen.  The supplement this time providing her with tramadol along with Bentyl.  I would like to see her back in about a week.  Should have all my labs back by that time as well.  Urine leukocyte noted and I will send for culture.  No urinary symptoms on exam. -     GI Profile, Stool, PCR -     POCT CBC -     Sedimentation Rate -     Renal Function Panel -     Hepatic Function Panel -     Celiac Panel -     Calprotectin, Fecal -     POCT urinalysis dipstick -     POCT urine pregnancy -     Iron, TIBC and Ferritin Panel -      dicyclomine (BENTYL) 10 MG capsule; Take 1 capsule (10 mg total) by mouth 4 (four) times daily -  before meals and at bedtime. -     traMADol (ULTRAM) 50 MG tablet; Take 0.5-1 tablets (25-50 mg total) by mouth every 6 (six) hours as needed.    The patient is advised to call or return to clinic if she  does not see an improvement in symptoms, or to seek the care of the closest emergency department if she worsens with the above plan.   Deliah Boston, MHS, PA-C Primary Care at [facility] 12/10/2017 5:23 PM

## 2017-12-12 LAB — URINE CULTURE

## 2017-12-14 LAB — HEPATIC FUNCTION PANEL
ALK PHOS: 71 IU/L (ref 39–117)
ALT: 13 IU/L (ref 0–32)
AST: 17 IU/L (ref 0–40)
Bilirubin Total: <0.2 mg/dL (ref 0.0–1.2)
Bilirubin, Direct: 0.05 mg/dL (ref 0.00–0.40)
TOTAL PROTEIN: 7.5 g/dL (ref 6.0–8.5)

## 2017-12-14 LAB — RENAL FUNCTION PANEL
Albumin: 4.2 g/dL (ref 3.5–5.5)
BUN / CREAT RATIO: 16 (ref 9–23)
BUN: 11 mg/dL (ref 6–20)
CALCIUM: 9.2 mg/dL (ref 8.7–10.2)
CO2: 21 mmol/L (ref 20–29)
CREATININE: 0.68 mg/dL (ref 0.57–1.00)
Chloride: 106 mmol/L (ref 96–106)
GFR calc Af Amer: 140 mL/min/{1.73_m2} (ref >59)
GFR, EST NON AFRICAN AMERICAN: 121 mL/min/{1.73_m2} (ref >59)
Glucose: 89 mg/dL (ref 65–99)
Phosphorus: 3.2 mg/dL (ref 2.5–4.5)
Potassium: 3.8 mmol/L (ref 3.5–5.2)
Sodium: 141 mmol/L (ref 134–144)

## 2017-12-14 LAB — IRON,TIBC AND FERRITIN PANEL
Ferritin: 26 ng/mL (ref 15–150)
IRON SATURATION: 13 % — AB (ref 15–55)
Iron: 47 ug/dL (ref 27–159)
Total Iron Binding Capacity: 358 ug/dL (ref 250–450)
UIBC: 311 ug/dL (ref 131–425)

## 2017-12-14 LAB — SEDIMENTATION RATE: Sed Rate: 23 mm/hr (ref 0–32)

## 2017-12-14 LAB — GLIA (IGA/G) + TTG IGA
Antigliadin Abs, IgA: 3 units (ref 0–19)
GLIADIN IGG: 2 U (ref 0–19)
Transglutaminase IgA: <2 U/mL (ref 0–3)

## 2017-12-17 ENCOUNTER — Other Ambulatory Visit: Payer: Self-pay

## 2017-12-17 ENCOUNTER — Ambulatory Visit: Payer: Managed Care, Other (non HMO) | Admitting: Physician Assistant

## 2017-12-17 ENCOUNTER — Encounter: Payer: Self-pay | Admitting: Physician Assistant

## 2017-12-17 VITALS — BP 98/60 | HR 96 | Temp 98.2°F | Resp 18 | Ht 61.0 in | Wt 144.6 lb

## 2017-12-17 DIAGNOSIS — R197 Diarrhea, unspecified: Secondary | ICD-10-CM | POA: Diagnosis not present

## 2017-12-17 LAB — POCT CBC
Granulocyte percent: 55.3 %G (ref 37–80)
HEMATOCRIT: 38 % (ref 37.7–47.9)
HEMOGLOBIN: 12.4 g/dL (ref 12.2–16.2)
LYMPH, POC: 3.2 (ref 0.6–3.4)
MCH: 28.6 pg (ref 27–31.2)
MCHC: 32.8 g/dL (ref 31.8–35.4)
MCV: 87.4 fL (ref 80–97)
MID (cbc): 1.2 — AB (ref 0–0.9)
MPV: 7 fL (ref 0–99.8)
POC GRANULOCYTE: 5.5 (ref 2–6.9)
POC LYMPH PERCENT: 32.2 %L (ref 10–50)
POC MID %: 12.5 % — AB (ref 0–12)
Platelet Count, POC: 418 10*3/uL (ref 142–424)
RBC: 4.34 M/uL (ref 4.04–5.48)
RDW, POC: 13.5 %
WBC: 9.9 10*3/uL (ref 4.6–10.2)

## 2017-12-17 NOTE — Patient Instructions (Addendum)
Take 1-2 tabs of imodium in the evening before bed to prevent diarrhea. I have put in for you to see your mother's gastroenterologist. Hopefully your stool test will give me something to treat.      IF you received an x-ray today, you will receive an invoice from [facility]. Please contact [facility] at [phone] with questions or concerns regarding your invoice.   IF you received labwork today, you will receive an invoice from [facility]. Please contact [facility] at [phone] with questions or concerns regarding your invoice.   Our billing staff will not be able to assist you with questions regarding bills from these companies.  You will be contacted with the lab results as soon as they are available. The fastest way to get your results is to activate your My Chart account. Instructions are located on the last page of this paperwork. If you have not heard from [place] regarding the results in 2 weeks, please contact this office.

## 2017-12-17 NOTE — Progress Notes (Signed)
12/22/2017 2:10 PM   DOB: Aug 23, 1990 / MRN: [id number]  SUBJECTIVE:  Gwendolyn Rice is a 27 y.o. female presenting for follow-up of abdominal pain and bloody diarrhea.  Reports that her symptoms are no better.  Work-up thus far has been negative including a negative sed rate.  She continues to have blood in her stools.  She is allergic to amoxicillin; ciprofloxacin; penicillins; sulfa antibiotics; flagyl [metronidazole]; and macrobid [nitrofurantoin monohyd macro].   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    She  reports that she has never smoked. She has never used smokeless tobacco. She reports that she drinks about 0.6 oz of alcohol per week. She reports that she does not use drugs. She  reports that she currently engages in sexual activity and has had partners who are Female. She reports using the following method of birth control/protection: Pill. The patient  has a past surgical history that includes LEEP (N/A, 07/23/2013).  Her family history includes Heart attack in her maternal grandmother.  Review of Systems  Constitutional: Negative for chills, diaphoresis and fever.  Eyes: Negative.   Respiratory: Negative for cough, hemoptysis, sputum production, shortness of breath and wheezing.   Cardiovascular: Negative for chest pain, orthopnea and leg swelling.  Gastrointestinal: Positive for abdominal pain, blood in stool and diarrhea. Negative for constipation, heartburn, melena, nausea and vomiting.  Skin: Negative for rash.  Neurological: Negative for dizziness, sensory change, speech change, focal weakness and headaches.    The problem list and medications were reviewed and updated by myself where necessary and exist elsewhere in the encounter.   OBJECTIVE:  BP 98/60 (BP Location: Left Arm, Patient Position: Sitting, Cuff Size:  Normal)   Pulse 96   Temp 98.2 F (36.8 C) (Oral)   Resp 18   Ht 5' 1"  (1.549 m)   Wt 144 lb 9.6 oz (65.6 kg)   SpO2 100%   BMI 27.32 kg/m   Wt Readings from Last 3 Encounters:  12/17/17 144 lb 9.6 oz (65.6 kg)  12/10/17 142 lb 6.4 oz (64.6 kg)  05/10/17 135 lb (61.2 kg)   Temp Readings from Last 3 Encounters:  12/17/17 98.2 F (36.8 C) (Oral)  12/10/17 98.1 F (36.7 C) (Oral)  05/10/17 98.6 F (37 C) (Oral)   BP Readings from Last 3 Encounters:  12/17/17 98/60  12/10/17 122/64  08/04/17 120/76   Pulse Readings from Last 3 Encounters:  12/17/17 96  12/10/17 (!) 103  05/10/17 (!) 107     Physical Exam  Constitutional: She is oriented to person, place, and time. She appears well-nourished. No distress.  Eyes: Pupils are equal, round, and reactive to light. EOM are normal.  Cardiovascular: Normal rate, regular rhythm, S1 normal, S2 normal, normal heart sounds and intact distal pulses. Exam reveals no gallop, no friction rub and no decreased pulses.  No murmur heard. Pulmonary/Chest: Effort normal. No stridor. No respiratory distress. She has no wheezes. She has no rales.  Abdominal: Bowel sounds are normal. She exhibits no shifting dullness, no distension, no pulsatile liver, no fluid wave, no abdominal bruit, no ascites, no pulsatile midline mass and no mass. There is no hepatosplenomegaly, splenomegaly or hepatomegaly. There is generalized tenderness. There is no rigidity, no rebound, no guarding, no CVA tenderness, no tenderness at McBurney's point and negative Murphy's sign. No hernia.  Musculoskeletal: She exhibits no edema.  Neurological:  She is alert and oriented to person, place, and time. No cranial nerve deficit. Gait normal.  Skin: Skin is dry. She is not diaphoretic.  Psychiatric: She has a normal mood and affect.  Vitals reviewed.   No results found for this or any previous visit (from the past 72 hour(s)).  No results found.  ASSESSMENT AND  PLAN:  Eura was seen today for abdominal pain and follow-up.  Diagnoses and all orders for this visit:  Bloody diarrhea exam continues to come back negative.  However her fecal calprotectin is positive.  Sed rate is negative.  Getting a scan of her abdomen so this may be reviewed by GI once they see her.  Patient is stable. -     Ambulatory referral to Gastroenterology -     Care order/instruction: -     POCT CBC    The patient is advised to call or return to clinic if she does not see an improvement in symptoms, or to seek the care of the closest emergency department if she worsens with the above plan.   Philis Fendt, MHS, PA-C Primary Care at [facility] 12/22/2017 2:10 PM

## 2017-12-19 LAB — GI PROFILE, STOOL, PCR
Adenovirus F 40/41: NOT DETECTED
Astrovirus: NOT DETECTED
C DIFFICILE TOXIN A/B: NOT DETECTED
CAMPYLOBACTER: NOT DETECTED
Cryptosporidium: NOT DETECTED
Cyclospora cayetanensis: NOT DETECTED
ENTAMOEBA HISTOLYTICA: NOT DETECTED
Enteroaggregative E coli: NOT DETECTED
Enteropathogenic E coli: NOT DETECTED
Enterotoxigenic E coli: NOT DETECTED
Giardia lamblia: NOT DETECTED
NOROVIRUS GI/GII: NOT DETECTED
PLESIOMONAS SHIGELLOIDES: NOT DETECTED
ROTAVIRUS A: NOT DETECTED
SAPOVIRUS: NOT DETECTED
SHIGELLA/ENTEROINVASIVE E COLI: NOT DETECTED
Salmonella: NOT DETECTED
Shiga-toxin-producing E coli: NOT DETECTED
VIBRIO CHOLERAE: NOT DETECTED
Vibrio: NOT DETECTED
Yersinia enterocolitica: NOT DETECTED

## 2017-12-19 LAB — CALPROTECTIN, FECAL: CALPROTECTIN, FECAL: 146 ug/g — AB (ref 0–120)

## 2017-12-19 NOTE — Addendum Note (Signed)
Addended by: Ofilia Neas on: 12/19/2017 08:17 AM   Modules accepted: Orders

## 2017-12-22 NOTE — Progress Notes (Signed)
I think I have completed. Deliah Boston, MS, PA-C 2:07 PM, 12/22/2017

## 2017-12-24 ENCOUNTER — Ambulatory Visit
Admission: RE | Admit: 2017-12-24 | Discharge: 2017-12-24 | Disposition: A | Discharge status: Home / Self Care | Payer: Managed Care, Other (non HMO) | Source: Ambulatory Visit | Attending: Physician Assistant | Admitting: Physician Assistant

## 2017-12-24 DIAGNOSIS — R1084 Generalized abdominal pain: Secondary | ICD-10-CM

## 2017-12-24 MED ORDER — IOPAMIDOL (ISOVUE-300) INJECTION 61%
100.0000 mL | Freq: Once | INTRAVENOUS | Status: AC | PRN
  Administered 2017-12-24: 100 mL via INTRAVENOUS

## 2017-12-26 ENCOUNTER — Other Ambulatory Visit: Payer: Self-pay | Admitting: Physician Assistant

## 2017-12-26 MED ORDER — AZITHROMYCIN 250 MG PO TABS: ORAL_TABLET | ORAL | 0 refills | Status: AC

## 2018-01-01 ENCOUNTER — Other Ambulatory Visit: Payer: Self-pay | Admitting: Physician Assistant

## 2018-01-01 ENCOUNTER — Telehealth: Payer: Self-pay

## 2018-01-01 DIAGNOSIS — K529 Noninfective gastroenteritis and colitis, unspecified: Secondary | ICD-10-CM

## 2018-01-01 NOTE — Telephone Encounter (Signed)
Bentyl 10 mg refill request    LOV 12/10/17 with Deliah Boston  [facility] [address] [place] - [address]

## 2018-01-01 NOTE — Telephone Encounter (Signed)
Copied from CRM [id number]. Topic: Inquiry >> Dec 31, 2017  4:06 PM Landry Mellow wrote: Reason for CRM:   Pt called - she is asking for provider to call her back, and also she would like to have ppw signed.  I advised her that she should drop off ppw and we will call when it is ready ans she replied that she can not make 2 trips.  She would not make appt, and she did not specify what the return call is in regards to. Cb is [phone]   IC pt - LMOVM for pt to return call and leave info on what type paperwork she needs and we will attempt to help her.

## 2018-01-02 ENCOUNTER — Telehealth: Payer: Self-pay | Admitting: Physician Assistant

## 2018-01-02 NOTE — Telephone Encounter (Signed)
Pt's mom has dropped off paperwork at [address] for Gwendolyn Rice to fill out. She would like a CB when ready at [phone]

## 2018-01-05 NOTE — Telephone Encounter (Signed)
Please update patient.  Thanks [employer]. Deliah Boston, MS, PA-C 4:02 PM, 01/05/2018

## 2018-01-08 DIAGNOSIS — Z0271 Encounter for disability determination: Secondary | ICD-10-CM

## 2018-01-13 ENCOUNTER — Encounter: Payer: Managed Care, Other (non HMO) | Admitting: Gynecology

## 2018-01-13 ENCOUNTER — Ambulatory Visit: Payer: Managed Care, Other (non HMO) | Admitting: Gynecology

## 2018-01-13 ENCOUNTER — Encounter: Payer: Self-pay | Admitting: Gynecology

## 2018-01-13 VITALS — BP 118/72 | Ht 61.0 in | Wt 141.0 lb

## 2018-01-13 DIAGNOSIS — Z113 Encounter for screening for infections with a predominantly sexual mode of transmission: Secondary | ICD-10-CM

## 2018-01-13 DIAGNOSIS — Z01411 Encounter for gynecological examination (general) (routine) with abnormal findings: Secondary | ICD-10-CM

## 2018-01-13 NOTE — Addendum Note (Signed)
Addended by: Dayna Barker on: 01/13/2018 04:50 PM   Modules accepted: Orders

## 2018-01-13 NOTE — Progress Notes (Signed)
    Gwendolyn Rice 12/23/1990 [id number]        26 y.o.  G0P0 for annual gynecologic exam.  Doing well on Depo-Provera.  Due for her next shot this coming June.  No bleeding at all.  History of positive chlamydia last year with follow-up test of cure negative.  Currently being evaluated for colitis with appointment with gastroenterology tomorrow.  Past medical history,surgical history, problem list, medications, allergies, family history and social history were all reviewed and documented as reviewed in the EPIC chart.  ROS:  Performed with pertinent positives and negatives included in the history, assessment and plan.   Additional significant findings : None   Exam: [name] assistant Vitals:   01/13/18 1556  BP: 118/72  Weight: 141 lb (64 kg)  Height: 5' 1"  (1.549 m)   Body mass index is 26.64 kg/m.  General appearance:  Normal affect, orientation and appearance. Skin: Grossly normal HEENT: Without gross lesions.  No cervical or supraclavicular adenopathy. Thyroid normal.  Lungs:  Clear without wheezing, rales or rhonchi Cardiac: RR, without RMG Abdominal:  Soft, nontender, without masses, guarding, rebound, organomegaly or hernia Breasts:  Examined lying and sitting without masses, retractions, discharge or axillary adenopathy. Pelvic:  Ext, BUS, Vagina: Normal  Cervix: Normal.  GC/Chlamydia  Uterus: Axial to anteverted, normal size, shape and contour, midline and mobile nontender   Adnexa: Without masses or tenderness    Anus and perineum: Normal   Assessment/Plan:  27 y.o. G0P0 female for annual gynecologic exam.  Without menses, Depo-Provera contraception  1. Depo-Provera.  Doing well and wants to continue.  Started 05/2017 with follow-up injection March 2019 due for shot in June and knows to follow-up for this. 2. Pap smear 12/2015.  No Pap smear done today.  Plan repeat Pap smear next year at 3-year interval per current screening guidelines. 3. Gardasil series  received. 4. Breast health.  Breast exam normal today.  SBE monthly reviewed. 5. STD screening.  History of chlamydia in the past.  GC/Chlamydia screen done today.  We have discussed the need for condoms regardless of other contraception for STD prevention. 6. Health maintenance.  No routine lab work done as patient does this elsewhere.  Follow-up for Depo-Provera when due in June otherwise follow-up for annual exam in 1 year.   Anastasio Auerbach MD, 4:39 PM 01/13/2018

## 2018-01-13 NOTE — Patient Instructions (Signed)
Follow-up when due for your next Depo-Provera shot.

## 2018-01-14 LAB — C. TRACHOMATIS/N. GONORRHOEAE RNA
C. trachomatis RNA, TMA: NOT DETECTED
N. gonorrhoeae RNA, TMA: NOT DETECTED

## 2018-02-02 ENCOUNTER — Other Ambulatory Visit: Payer: Self-pay | Admitting: Gynecology

## 2018-04-08 ENCOUNTER — Other Ambulatory Visit: Payer: Self-pay | Admitting: Gynecology

## 2018-04-28 ENCOUNTER — Ambulatory Visit (INDEPENDENT_AMBULATORY_CARE_PROVIDER_SITE_OTHER): Payer: Managed Care, Other (non HMO) | Admitting: Anesthesiology

## 2018-04-28 DIAGNOSIS — Z3042 Encounter for surveillance of injectable contraceptive: Secondary | ICD-10-CM

## 2018-04-28 DIAGNOSIS — N912 Amenorrhea, unspecified: Secondary | ICD-10-CM

## 2018-04-28 LAB — PREGNANCY, URINE: Preg Test, Ur: NEGATIVE

## 2018-04-28 MED ORDER — MEDROXYPROGESTERONE ACETATE 150 MG/ML IM SUSP
150.0000 mg | Freq: Once | INTRAMUSCULAR | Status: AC
  Administered 2018-04-28: 150 mg via INTRAMUSCULAR

## 2018-07-20 ENCOUNTER — Other Ambulatory Visit: Payer: Self-pay | Admitting: Gynecology

## 2018-08-03 ENCOUNTER — Ambulatory Visit (INDEPENDENT_AMBULATORY_CARE_PROVIDER_SITE_OTHER): Payer: Managed Care, Other (non HMO) | Admitting: Gynecology

## 2018-08-03 DIAGNOSIS — Z3042 Encounter for surveillance of injectable contraceptive: Secondary | ICD-10-CM | POA: Diagnosis not present

## 2018-08-03 MED ORDER — MEDROXYPROGESTERONE ACETATE 150 MG/ML IM SUSP
150.0000 mg | Freq: Once | INTRAMUSCULAR | Status: AC
  Administered 2018-08-03: 150 mg via INTRAMUSCULAR

## 2018-10-21 ENCOUNTER — Ambulatory Visit (INDEPENDENT_AMBULATORY_CARE_PROVIDER_SITE_OTHER): Payer: No Typology Code available for payment source | Admitting: Gynecology

## 2018-10-21 DIAGNOSIS — Z3042 Encounter for surveillance of injectable contraceptive: Secondary | ICD-10-CM | POA: Diagnosis not present

## 2018-10-21 MED ORDER — MEDROXYPROGESTERONE ACETATE 150 MG/ML IM SUSP
150.0000 mg | Freq: Once | INTRAMUSCULAR | Status: AC
  Administered 2018-10-21: 150 mg via INTRAMUSCULAR

## 2018-11-11 ENCOUNTER — Telehealth: Payer: Self-pay | Admitting: *Deleted

## 2018-11-11 ENCOUNTER — Other Ambulatory Visit: Payer: Self-pay

## 2018-11-11 NOTE — Telephone Encounter (Signed)
Patient called asking if she could come in to have STD screening testing along with U/A, history of positive Chlamydia in past. patient was at work and not able to discuss in detail, no itching, discharge, but very worried she may have STD and would prefer to come in for testing.  No past traveling, no fever, cough or shortness of breath, transferred to appointment desk to schedule.

## 2018-11-12 ENCOUNTER — Ambulatory Visit: Payer: No Typology Code available for payment source | Admitting: Gynecology

## 2018-11-12 ENCOUNTER — Encounter: Payer: Self-pay | Admitting: Gynecology

## 2018-11-12 VITALS — BP 116/74

## 2018-11-12 DIAGNOSIS — Z202 Contact with and (suspected) exposure to infections with a predominantly sexual mode of transmission: Secondary | ICD-10-CM | POA: Diagnosis not present

## 2018-11-12 NOTE — Progress Notes (Signed)
    Gwendolyn Rice [birth year] [id number]        28 y.o.  G0P0 presents requesting STD screening.  Unsure whether she has been exposed.  Past history of chlamydia.  No discharge irritation odor.  No UTI symptoms.  Past medical history,surgical history, problem list, medications, allergies, family history and social history were all reviewed and documented in the EPIC chart.  Directed ROS with pertinent positives and negatives documented in the history of present illness/assessment and plan.  Exam: [name] assistant Vitals:   11/12/18 1555  BP: 116/74   General appearance:  Normal Abdomen soft nontender without masses guarding rebound Pelvic external BUS vagina normal.  Cervix normal.  GC/chlamydia screen done.  Uterus grossly normal size midline mobile nontender.  Adnexa without masses or tenderness.  Assessment/Plan:  28 y.o. G0P0 requesting STD screening.  GC/chlamydia, HIV, RPR, hepatitis B, hepatitis C done.  Patient will follow-up for results.  Patient will follow-up in May when due for annual exam.    Dara Lords MD, 4:18 PM 11/12/2018

## 2018-11-12 NOTE — Patient Instructions (Signed)
Follow-up in May when due for annual exam

## 2018-11-13 ENCOUNTER — Encounter: Payer: Self-pay | Admitting: Gynecology

## 2018-11-13 LAB — C. TRACHOMATIS/N. GONORRHOEAE RNA
C. TRACHOMATIS RNA, TMA: NOT DETECTED
N. GONORRHOEAE RNA, TMA: NOT DETECTED

## 2018-11-13 LAB — HIV ANTIBODY (ROUTINE TESTING W REFLEX): HIV 1&2 Ab, 4th Generation: NONREACTIVE

## 2018-11-13 LAB — HEPATITIS B SURFACE ANTIGEN: Hepatitis B Surface Ag: NONREACTIVE

## 2018-11-13 LAB — RPR: RPR Ser Ql: NONREACTIVE

## 2018-11-13 LAB — HEPATITIS C ANTIBODY
HEP C AB: NONREACTIVE
SIGNAL TO CUT-OFF: 0.01 (ref <1.00)

## 2018-12-08 ENCOUNTER — Encounter: Payer: Self-pay | Admitting: Internal Medicine

## 2018-12-08 ENCOUNTER — Other Ambulatory Visit: Payer: Self-pay

## 2018-12-08 ENCOUNTER — Ambulatory Visit (INDEPENDENT_AMBULATORY_CARE_PROVIDER_SITE_OTHER): Payer: No Typology Code available for payment source | Admitting: Internal Medicine

## 2018-12-08 DIAGNOSIS — R5383 Other fatigue: Secondary | ICD-10-CM | POA: Diagnosis not present

## 2018-12-08 DIAGNOSIS — K51811 Other ulcerative colitis with rectal bleeding: Secondary | ICD-10-CM | POA: Diagnosis not present

## 2018-12-08 NOTE — Assessment & Plan Note (Signed)
Continue Imuran and Apriso Encouraged her to continue to follow with GI

## 2018-12-08 NOTE — Patient Instructions (Signed)

## 2018-12-08 NOTE — Progress Notes (Signed)
Virtual Visit via Video Note  I connected with Gwendolyn Rice on 12/08/18 at  3:15 PM EDT by a video enabled telemedicine application and verified that I am speaking with the correct person using two identifiers.   I discussed the limitations of evaluation and management by telemedicine and the availability of in person appointments. The patient expressed understanding and agreed to proceed.  Patient Location: In Her Gaffer Location: Office  History of Present Illness:  Pt needing to reestablish care. She is transferring care from Deliah Boston, PA-C.   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 She has had a colonoscopy. She follows with GI.   She is concerned because she is really tired all the time. She feels cold at times and is intermittently short of breath. She occasionally has blood in her stool. She would like her labs checked today.  Flu: never Tetanus: 03/2016 Pap Smear: 12/2017 Dentist: as needed  Past Medical History:  Diagnosis Date  . Anginal pain (HCC)    since childhoood, inflammation around the chest  . Chlamydia 12/2016, 05/2017  . Colitis   . Diarrhea   . 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 History of kidney stones     Current Outpatient Medications  Medication Sig Dispense Refill  . FOLIC ACID PO Take by mouth.    . medroxyPROGESTERone (DEPO-PROVERA) 150 MG/ML injection INJECT 1 ML (150 MG TOTAL) INTO THE MUSCLE ONCE FOR 1 DOSE. 1 mL 1   No current facility-administered medications for this visit.     Allergies  Allergen Reactions  . Amoxicillin Swelling  . Ciprofloxacin Swelling  . Penicillins Other (See Comments)    Unknown reaction. as patient had a PCN reaction causing immediate rash, facial/tongue/throat swelling, SOB or lightheadedness with hypotension: unknown Has patient had a PCN reaction causing severe rash involving mucus membranes or skin necrosis:  unknown Has patient had a PCN reaction that required hospitalization: no Has patient had a PCN reaction occurring within the last 10 years: yes If all of the above answers are "NO", then may proceed with Cephalosporin use.   . Sulfa Antibiotics Swelling  . Flagyl [Metronidazole] Nausea And Vomiting  . Macrobid [Nitrofurantoin Monohyd Macro]     States she gets very sick    Family History  Problem Relation Age of Onset  . Heart attack Maternal Grandmother     Social History   Socioeconomic History  . Marital status: Single    Spouse name: n/a  . Number of children: 0  . Years of education: Not on file  . Highest education level: Not on file  Occupational History  . Occupation: assembly line    Comment: Audiological scientist  . Financial resource strain: Not on file  . Food insecurity:    Worry: Not on file    Inability: Not on file  . Transportation needs:    Medical: Not on file    Non-medical: Not on file  Tobacco Use  . Smoking status: Never Smoker  . Smokeless tobacco: Never Used  Substance and Sexual Activity  . Alcohol use: Yes    Alcohol/week: 1.0 standard drinks    Types: 1 Cans of beer per week    Comment: Rare  . Drug use: No  . Sexual activity: Yes    Partners: Male    Birth control/protection: Injection    Comment: 1st intercourse 57 yo-5 partners  Lifestyle  . Physical activity:  Days per week: Not on file    Minutes per session: Not on file  . Stress: Not on file  Relationships  . Social connections:    Talks on phone: Not on file    Gets together: Not on file    Attends religious service: Not on file    Active member of club or organization: Not on file    Attends meetings of clubs or organizations: Not on file    Relationship status: Not on file  . Intimate partner violence:    Fear of current or ex partner: Not on file    Emotionally abused: Not on file    Physically abused: Not on file    Forced sexual activity: Not on  file  Other Topics Concern  . Not on file  Social History Narrative   Lives with both parents.   She has a brother, 9 years older, who lives independently.     Constitutional: Pt reports fatigue. Denies fever, malaise, headache or abrupt weight changes.  HEENT: Denies eye pain, eye redness, ear pain, ringing in the ears, wax buildup, runny nose, nasal congestion, bloody nose, or sore throat. Respiratory: Pt reports intermittent shortness of breath. Denies difficulty breathing, cough or sputum production.   Cardiovascular: Denies chest pain, chest tightness, palpitations or swelling in the hands or feet.  Gastrointestinal: Pt reports blood in stool. Denies abdominal pain, bloating, constipation, diarrhea.  GU: Denies urgency, frequency, pain with urination, burning sensation, blood in urine, odor or discharge. Musculoskeletal: Denies decrease in range of motion, difficulty with gait, muscle pain or joint pain and swelling.  Skin: Denies redness, rashes, lesions or ulcercations.  Neurological: Denies dizziness, difficulty with memory, difficulty with speech or problems with balance and coordination.  Psych: Denies anxiety, depression, SI/HI.  No other specific complaints in a complete review of systems (except as listed in HPI above).  Wt Readings from Last 3 Encounters:  01/13/18 141 lb (64 kg)  12/17/17 144 lb 9.6 oz (65.6 kg)  12/10/17 142 lb 6.4 oz (64.6 kg)    General: Appears her stated age, well developed, well nourished in NAD. Skin: Pale. Pulmonary/Chest: Normal effort. No respiratory distress.  Abdomen: No obvious bloating. Neurological: Alert and oriented.  Psychiatric: Mood and affect normal. Behavior is normal. Judgment and thought content normal.     BMET    Component Value Date/Time   NA 141 12/10/2017 1723   K 3.8 12/10/2017 1723   CL 106 12/10/2017 1723   CO2 21 12/10/2017 1723   GLUCOSE 89 12/10/2017 1723   GLUCOSE 102 (H) 05/10/2017 1032   BUN 11  12/10/2017 1723   CREATININE 0.68 12/10/2017 1723   CREATININE 0.83 12/30/2016 1621   CALCIUM 9.2 12/10/2017 1723   GFRNONAA 121 12/10/2017 1723   GFRAA 140 12/10/2017 1723    Lipid Panel  No results found for: CHOL, TRIG, HDL, CHOLHDL, VLDL, LDLCALC  CBC    Component Value Date/Time   WBC 9.9 12/17/2017 1713   WBC 8.9 05/10/2017 1032   RBC 4.34 12/17/2017 1713   RBC 4.11 05/10/2017 1032   HGB 12.4 12/17/2017 1713   HGB 13.0 05/10/2017 1032   HCT 38.0 12/17/2017 1713   HCT 37.1 05/10/2017 1032   PLT 359 05/10/2017 1032   MCV 87.4 12/17/2017 1713   MCH 28.6 12/17/2017 1713   MCH 31.6 05/10/2017 1032   MCHC 32.8 12/17/2017 1713   MCHC 35.0 05/10/2017 1032   RDW 12.5 05/10/2017 1032   LYMPHSABS 2.7 05/10/2017  1032   MONOABS 0.8 05/10/2017 1032   EOSABS 0.7 05/10/2017 1032   BASOSABS 0.0 05/10/2017 1032    Hgb A1C No results found for: HGBA1C       Assessment and Plan:  Fatigue, SOB:  ? Iron deficiency anemia Will check CBC, CMET, Ferritin, IBC panel, Vit D an B12  Follow Up Instructions:    I discussed the assessment and treatment plan with the patient. The patient was provided an opportunity to ask questions and all were answered. The patient agreed with the plan and demonstrated an understanding of the instructions.   The patient was advised to call back or seek an in-person evaluation if the symptoms worsen or if the condition fails to improve as anticipated.     Nicki Reaperegina Lavena Loretto, NP

## 2018-12-09 ENCOUNTER — Other Ambulatory Visit (INDEPENDENT_AMBULATORY_CARE_PROVIDER_SITE_OTHER): Payer: No Typology Code available for payment source

## 2018-12-09 DIAGNOSIS — R5383 Other fatigue: Secondary | ICD-10-CM

## 2018-12-09 DIAGNOSIS — K51811 Other ulcerative colitis with rectal bleeding: Secondary | ICD-10-CM | POA: Diagnosis not present

## 2018-12-10 ENCOUNTER — Other Ambulatory Visit: Payer: Self-pay | Admitting: Internal Medicine

## 2018-12-10 LAB — CBC WITH DIFFERENTIAL/PLATELET
Basophils Absolute: 0.1 10*3/uL (ref 0.0–0.1)
Basophils Relative: 1.2 % (ref 0.0–3.0)
Eosinophils Absolute: 0.5 10*3/uL (ref 0.0–0.7)
Eosinophils Relative: 6 % — ABNORMAL HIGH (ref 0.0–5.0)
HCT: 36.2 % (ref 36.0–46.0)
Hemoglobin: 12.2 g/dL (ref 12.0–15.0)
Lymphocytes Relative: 24.8 % (ref 12.0–46.0)
Lymphs Abs: 2.2 10*3/uL (ref 0.7–4.0)
MCHC: 33.7 g/dL (ref 30.0–36.0)
MCV: 95.1 fl (ref 78.0–100.0)
Monocytes Absolute: 0.7 10*3/uL (ref 0.1–1.0)
Monocytes Relative: 8.1 % (ref 3.0–12.0)
Neutro Abs: 5.2 10*3/uL (ref 1.4–7.7)
Neutrophils Relative %: 59.9 % (ref 43.0–77.0)
Platelets: 387 10*3/uL (ref 150.0–400.0)
RBC: 3.81 Mil/uL — ABNORMAL LOW (ref 3.87–5.11)
RDW: 15.4 % (ref 11.5–15.5)
WBC: 8.7 10*3/uL (ref 4.0–10.5)

## 2018-12-10 LAB — VITAMIN D 25 HYDROXY (VIT D DEFICIENCY, FRACTURES): VITD: 20.15 ng/mL — ABNORMAL LOW (ref 30.00–100.00)

## 2018-12-10 LAB — COMPREHENSIVE METABOLIC PANEL
ALT: 10 U/L (ref 0–35)
AST: 17 U/L (ref 0–37)
Albumin: 4.5 g/dL (ref 3.5–5.2)
Alkaline Phosphatase: 50 U/L (ref 39–117)
BUN: 14 mg/dL (ref 6–23)
CO2: 26 mEq/L (ref 19–32)
Calcium: 9.3 mg/dL (ref 8.4–10.5)
Chloride: 103 mEq/L (ref 96–112)
Creatinine, Ser: 0.68 mg/dL (ref 0.40–1.20)
GFR: 103.27 mL/min (ref >60.00)
Glucose, Bld: 76 mg/dL (ref 70–99)
Potassium: 3.7 mEq/L (ref 3.5–5.1)
Sodium: 137 mEq/L (ref 135–145)
Total Bilirubin: 0.5 mg/dL (ref 0.2–1.2)
Total Protein: 7.7 g/dL (ref 6.0–8.3)

## 2018-12-10 LAB — FERRITIN: Ferritin: 12.9 ng/mL (ref 10.0–291.0)

## 2018-12-10 LAB — TSH: TSH: 1.58 u[IU]/mL (ref 0.35–4.50)

## 2018-12-10 LAB — IBC PANEL
Iron: 84 ug/dL (ref 42–145)
Saturation Ratios: 21.1 % (ref 20.0–50.0)
Transferrin: 284 mg/dL (ref 212.0–360.0)

## 2018-12-10 LAB — VITAMIN B12: Vitamin B-12: 155 pg/mL — ABNORMAL LOW (ref 211–911)

## 2018-12-10 MED ORDER — VITAMIN D (ERGOCALCIFEROL) 1.25 MG (50000 UNIT) PO CAPS: 50000.0000 [IU] | ORAL_CAPSULE | ORAL | 0 refills | Status: DC

## 2018-12-13 ENCOUNTER — Encounter: Payer: Self-pay | Admitting: Internal Medicine

## 2018-12-15 NOTE — Telephone Encounter (Signed)
Pt called back to get lab results; pt notified as instructed and voiced understanding. Pt does not have [employer] order any longer; pt wants Vit D sent to [facility] and cancel rx went to [employer] order.

## 2018-12-17 NOTE — Telephone Encounter (Signed)
Pt came in and wants to be sure the pharmacy was updated because she has not heard anything from the pharmacy.  4/28 @3 :51p below by Rena Pt called back to get lab results; pt notified as instructed and voiced understanding. Pt does not have [employer] order any longer; pt wants Vit D sent to [facility] and cancel rx went to [employer] order.

## 2018-12-23 NOTE — Telephone Encounter (Signed)
Patient called wanting to know why her Vitamin D script has not been sent to the right pharmacy?  Patient stated that she has talked to several people at the office in the last week.and can not understand why this has not been done yet Patient wants a call back when this has been taken care of.

## 2018-12-25 MED ORDER — VITAMIN D (ERGOCALCIFEROL) 1.25 MG (50000 UNIT) PO CAPS: 50000.0000 [IU] | ORAL_CAPSULE | ORAL | 0 refills | Status: DC

## 2018-12-25 NOTE — Telephone Encounter (Signed)
Pt calling and upset because the Vit D has not been sent to [facility] and pt will not get med from [place] mail order because she no longer has that ins. Pt wants local pharmacy at [facility]. I advised pt I had sent the vit D cap rx electronically to [facility] and pt said she has trouble taking capsules. I called and spoke with Alycia Rossetti at [facility] and he has received the Vit D 1.25 mg caps rx and he will get started on it and have it ready for pick up shortly. I asked if that rx comes in tabs and he said no the 50,000 UT only comes in caps. I thanked him and he will get rx ready. I advised pt of this info and asked if she liked yogurt and she said yes so I offered a suggestion to take a big spoonful of yogurt and insert the capsule (do not open cap) into the middle of spoonful of yogurt and see if that would help swallow the cap. Pt appreciated info and me calling [facility]. Pt request cb next wk why it took so long to get the Vit D sent to [facility].

## 2019-01-04 ENCOUNTER — Other Ambulatory Visit: Payer: Self-pay | Admitting: Gynecology

## 2019-01-19 ENCOUNTER — Other Ambulatory Visit: Payer: Self-pay

## 2019-01-20 ENCOUNTER — Ambulatory Visit (INDEPENDENT_AMBULATORY_CARE_PROVIDER_SITE_OTHER): Payer: No Typology Code available for payment source | Admitting: Gynecology

## 2019-01-20 ENCOUNTER — Encounter: Payer: Self-pay | Admitting: Gynecology

## 2019-01-20 VITALS — BP 116/70 | Ht 61.0 in | Wt 153.0 lb

## 2019-01-20 DIAGNOSIS — Z308 Encounter for other contraceptive management: Secondary | ICD-10-CM

## 2019-01-20 DIAGNOSIS — Z01419 Encounter for gynecological examination (general) (routine) without abnormal findings: Secondary | ICD-10-CM | POA: Diagnosis not present

## 2019-01-20 DIAGNOSIS — Z3042 Encounter for surveillance of injectable contraceptive: Secondary | ICD-10-CM | POA: Diagnosis not present

## 2019-01-20 MED ORDER — MEDROXYPROGESTERONE ACETATE 150 MG/ML IM SUSP
150.0000 mg | Freq: Once | INTRAMUSCULAR | Status: AC
  Administered 2019-01-20: 150 mg via INTRAMUSCULAR

## 2019-01-20 NOTE — Patient Instructions (Signed)
Call if you want to start on birth control pills as we discussed within the next 3 months before your Depo-Provera runs out.  Continue on the Depo-Provera if you choose for the next year.  Follow-up in 1 year for annual exam.

## 2019-01-20 NOTE — Progress Notes (Signed)
    Gwendolyn Rice 04/12/1991 [id number]        28 y.o.  G0P0 for annual gynecologic exam.  Without gynecologic complaints.  Past medical history,surgical history, problem list, medications, allergies, family history and social history were all reviewed and documented as reviewed in the EPIC chart.  ROS:  Performed with pertinent positives and negatives included in the history, assessment and plan.   Additional significant findings : None   Exam: [name] assistant Vitals:   01/20/19 1606  BP: 116/70  Weight: 153 lb (69.4 kg)  Height: 5\' 1"  (1.549 m)   Body mass index is 28.91 kg/m.  General appearance:  Normal affect, orientation and appearance. Skin: Grossly normal HEENT: Without gross lesions.  No cervical or supraclavicular adenopathy. Thyroid normal.  Lungs:  Clear without wheezing, rales or rhonchi Cardiac: RR, without RMG Abdominal:  Soft, nontender, without masses, guarding, rebound, organomegaly or hernia Breasts:  Examined lying and sitting without masses, retractions, discharge or axillary adenopathy. Pelvic:  Ext, BUS, Vagina: Normal  Cervix: Normal.  Pap smear done  Uterus: Anteverted, normal size, shape and contour, midline and mobile nontender   Adnexa: Without masses or tenderness    Anus and perineum: Normal     Assessment/Plan:  28 y.o. G0P0 female for annual gynecologic exam.  Without menses, Depo-Provera  1. Depo-Provera contraception.  Remains on Depo-Provera doing well.  Started 05/2017.  We discussed the black box warning as far as loss of calcium from the bones and the 2-year recommended interval to use.  She had used this several years ago and the question is whether is cumulative or not was discussed with her.  We rediscussed other options to include oral contraceptives which she has used in the past.  I do not think she would tolerate the placement of an IUD as doing a Pap smear is challenging.  At this point the patient received her  Depo-Provera today she is going to decide whether she wants to continue on it through this year and then switch over or whether she wants to switch to oral contraceptives in 3 months when her Depo-Provera comes due. 2. Breast health.  Breast exam normal today.  SBE monthly reviewed. 3. Gardasil series reportedly received. 4. STD screening.  Had screening to include GC/chlamydia and blood screening in March.  Declines screening now. 5. Pap smear 2017.  Pap smear done today.  No history of abnormal Pap smears previously. 6. Health maintenance.  No routine lab work done as patient reports is done elsewhere.  Follow-up exam in 1 year, sooner with her birth control decision.   Dara Lords MD, 4:38 PM 01/20/2019

## 2019-01-20 NOTE — Addendum Note (Signed)
Addended by: Dayna Barker on: 01/20/2019 04:55 PM   Modules accepted: Orders

## 2019-01-22 ENCOUNTER — Other Ambulatory Visit: Payer: Self-pay | Admitting: Gynecology

## 2019-01-22 LAB — PAP IG W/ RFLX HPV ASCU

## 2019-01-22 MED ORDER — FLUCONAZOLE 150 MG PO TABS: 150.0000 mg | ORAL_TABLET | Freq: Once | ORAL | 0 refills | Status: AC

## 2019-03-13 ENCOUNTER — Other Ambulatory Visit: Payer: Self-pay | Admitting: Internal Medicine

## 2019-03-31 ENCOUNTER — Other Ambulatory Visit: Payer: Self-pay | Admitting: Gynecology

## 2019-03-31 NOTE — Telephone Encounter (Signed)
Per 01/20/19 note " At this point the patient received her Depo-Provera today she is going to decide whether she wants to continue on it through this year and then switch over or whether she wants to switch to oral contraceptives in 3 months when her Depo-Provera comes due."

## 2019-05-10 ENCOUNTER — Encounter: Payer: Self-pay | Admitting: Gynecology

## 2019-11-15 ENCOUNTER — Ambulatory Visit (INDEPENDENT_AMBULATORY_CARE_PROVIDER_SITE_OTHER): Payer: No Typology Code available for payment source | Admitting: Internal Medicine

## 2019-11-15 ENCOUNTER — Encounter: Payer: Self-pay | Admitting: Internal Medicine

## 2019-11-15 ENCOUNTER — Other Ambulatory Visit: Payer: Self-pay

## 2019-11-15 VITALS — BP 118/72 | HR 92 | Temp 98.0°F | Wt 159.0 lb

## 2019-11-15 DIAGNOSIS — H6123 Impacted cerumen, bilateral: Secondary | ICD-10-CM | POA: Diagnosis not present

## 2019-11-15 NOTE — Progress Notes (Signed)
Subjective:    Patient ID: Gwendolyn Rice, female    DOB: Mar 10, 1991, 29 y.o.   MRN: [id number]  HPI  Pt presents to the clinic today with c/o bilateral ear pain, R>L. This started 2-3 days ago. She describes the pain as pressure. She has some ringing in the ears with loss of hearing. She denies drainage from the ears. She denies headache, runny nose, nasal congestion, sore throat or cough. She denies fever, chills or body aches. She has a history of wax buildup. She has tried Debrox OTC with minimal results.  Review of Systems      Past Medical History:  Diagnosis Date  . Anginal pain ([place])    since childhoood, inflammation around the chest  . Chlamydia 12/2016, 05/2017  . Colitis   . Diarrhea   . 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 History of kidney stones     Current Outpatient Medications  Medication Sig Dispense Refill  . azaTHIOprine (IMURAN) 50 MG tablet Take 150 mg by mouth daily.    . folic acid (FOLVITE) 1 MG tablet Take 1 mg by mouth daily.    . medroxyPROGESTERone (DEPO-PROVERA) 150 MG/ML injection INJECT 1 ML (150 MG TOTAL) INTO THE MUSCLE ONCE FOR 1 DOSE. 1 mL 0  . mesalamine (APRISO) 0.375 g 24 hr capsule TAKE 4 CAPSULES BY MOUTH EVERY MORNING    . Vitamin D, Ergocalciferol, (DRISDOL) 1.25 MG (50000 UT) CAPS capsule Take 1 capsule (50,000 Units total) by mouth every 7 (seven) days. 12 capsule 0   No current facility-administered medications for this visit.    Allergies  Allergen Reactions  . Amoxicillin Swelling  . Ciprofloxacin Swelling  . Penicillins Other (See Comments)    Unknown reaction. as patient had a PCN reaction causing immediate rash, facial/tongue/throat swelling, SOB or lightheadedness with hypotension: unknown Has patient had a PCN reaction causing severe rash involving mucus membranes or skin necrosis: unknown Has patient had a PCN reaction that required hospitalization: no Has  patient had a PCN reaction occurring within the last 10 years: yes If all of the above answers are "NO", then may proceed with Cephalosporin use.   . Sulfa Antibiotics Swelling  . Flagyl [Metronidazole] Nausea And Vomiting  . Macrobid [Nitrofurantoin Monohyd Macro]     States she gets very sick    Family History  Problem Relation Age of Onset  . Heart attack Maternal Grandmother   . Cancer Paternal Aunt     Social History   Socioeconomic History  . Marital status: Single    Spouse name: n/a  . Number of children: 0  . Years of education: Not on file  . Highest education level: Not on file  Occupational History  . Occupation: assembly line    Comment: Nurse, adult  Tobacco Use  . Smoking status: Never Smoker  . Smokeless tobacco: Never Used  Substance and Sexual Activity  . Alcohol use: Yes    Alcohol/week: 1.0 standard drinks    Types: 1 Cans of beer per week    Comment: Rare  . Drug use: No  . Sexual activity: Yes    Partners: Male    Birth control/protection: Injection    Comment: 1st intercourse 84 yo-5 partners  Other Topics Concern  . Not on file  Social History Narrative   Lives with both parents.   She has a brother, 73 years older, who lives independently.   Social Determinants of Health  Financial Resource Strain:   . Difficulty of Paying Living Expenses:   Food Insecurity:   . Worried About Charity fundraiser in the Last Year:   . Arboriculturist in the Last Year:   Transportation Needs:   . Film/video editor (Medical):   Marland Kitchen Lack of Transportation (Non-Medical):   Physical Activity:   . Days of Exercise per Week:   . Minutes of Exercise per Session:   Stress:   . Feeling of Stress :   Social Connections:   . Frequency of Communication with Friends and Family:   . Frequency of Social Gatherings with Friends and Family:   . Attends Religious Services:   . Active Member of Clubs or Organizations:   . Attends Archivist  Meetings:   Marland Kitchen Marital Status:   Intimate Partner Violence:   . Fear of Current or Ex-Partner:   . Emotionally Abused:   Marland Kitchen Physically Abused:   . Sexually Abused:      Constitutional: Denies fever, malaise, fatigue, headache or abrupt weight changes.  HEENT: Pt reports bilateral ear pain, ringing, loss of hearing and wax buildup. Denies eye pain, eye redness, runny nose, nasal congestion, bloody nose, or sore throat. Respiratory: Denies difficulty breathing, shortness of breath, cough or sputum production.   Cardiovascular: Denies chest pain, chest tightness, palpitations or swelling in the hands or feet.   No other specific complaints in a complete review of systems (except as listed in HPI above).  Objective:   Physical Exam  BP 118/72   Pulse 92   Temp 98 F (36.7 C) (Temporal)   Wt 159 lb (72.1 kg)   SpO2 98%   BMI 30.04 kg/m   Wt Readings from Last 3 Encounters:  01/20/19 153 lb (69.4 kg)  01/13/18 141 lb (64 kg)  12/17/17 144 lb 9.6 oz (65.6 kg)    General: Appears her stated age, well developed, well nourished in NAD. Skin: Warm, dry and intact. No rashes noted. HEENT: Head: normal shape and size;  Ears: bilateral cerumen impaction. No pain with palpation of tragus, pulling on Pinna;  Neck:  No adenopathy noted Cardiovascular: Normal rate and rhythm.  Pulmonary/Chest: Normal effort. Neurological: Alert and oriented.    BMET    Component Value Date/Time   NA 137 12/09/2018 1556   NA 141 12/10/2017 1723   K 3.7 12/09/2018 1556   CL 103 12/09/2018 1556   CO2 26 12/09/2018 1556   GLUCOSE 76 12/09/2018 1556   BUN 14 12/09/2018 1556   BUN 11 12/10/2017 1723   CREATININE 0.68 12/09/2018 1556   CREATININE 0.83 12/30/2016 1621   CALCIUM 9.3 12/09/2018 1556   GFRNONAA 121 12/10/2017 1723   GFRAA 140 12/10/2017 1723    Lipid Panel  No results found for: CHOL, TRIG, HDL, CHOLHDL, VLDL, LDLCALC  CBC    Component Value Date/Time   WBC 8.7 12/09/2018 1556    RBC 3.81 (L) 12/09/2018 1556   HGB 12.2 12/09/2018 1556   HCT 36.2 12/09/2018 1556   PLT 387.0 12/09/2018 1556   MCV 95.1 12/09/2018 1556   MCV 87.4 12/17/2017 1713   MCH 28.6 12/17/2017 1713   MCH 31.6 05/10/2017 1032   MCHC 33.7 12/09/2018 1556   RDW 15.4 12/09/2018 1556   LYMPHSABS 2.2 12/09/2018 1556   MONOABS 0.7 12/09/2018 1556   EOSABS 0.5 12/09/2018 1556   BASOSABS 0.1 12/09/2018 1556    Hgb A1C No results found for: HGBA1C  Assessment & Plan:   Hearing Loss secondary to Bilateral Cerumen Impaction:  Manual lavage by CMA Advised her once ear are fully clean, to try Debrox OTC 2 x week to prevent wax buildup  Encouraged her to schedule an appt her annual exam Webb Silversmith, NP This visit occurred during the SARS-CoV-2 public health emergency.  Safety protocols were in place, including screening questions prior to the visit, additional usage of staff PPE, and extensive cleaning of exam room while observing appropriate contact time as indicated for disinfecting solutions.

## 2019-11-15 NOTE — Patient Instructions (Signed)
Earwax Buildup, Adult The ears produce a substance called earwax that helps keep bacteria out of the ear and protects the skin in the ear canal. Occasionally, earwax can build up in the ear and cause discomfort or hearing loss. What increases the risk? This condition is more likely to develop in people who:  Are female.  Are elderly.  Naturally produce more earwax.  Clean their ears often with cotton swabs.  Use earplugs often.  Use in-ear headphones often.  Wear hearing aids.  Have narrow ear canals.  Have earwax that is overly thick or sticky.  Have eczema.  Are dehydrated.  Have excess hair in the ear canal. What are the signs or symptoms? Symptoms of this condition include:  Reduced or muffled hearing.  A feeling of fullness in the ear or feeling that the ear is plugged.  Fluid coming from the ear.  Ear pain.  Ear itch.  Ringing in the ear.  Coughing.  An obvious piece of earwax that can be seen inside the ear canal. How is this diagnosed? This condition may be diagnosed based on:  Your symptoms.  Your medical history.  An ear exam. During the exam, your health care provider will look into your ear with an instrument called an otoscope. You may have tests, including a hearing test. How is this treated? This condition may be treated by:  Using ear drops to soften the earwax.  Having the earwax removed by a health care provider. The health care provider may: ? Flush the ear with water. ? Use an instrument that has a loop on the end (curette). ? Use a suction device.  Surgery to remove the wax buildup. This may be done in severe cases. Follow these instructions at home:   Take over-the-counter and prescription medicines only as told by your health care provider.  Do not put any objects, including cotton swabs, into your ear. You can clean the opening of your ear canal with a washcloth or facial tissue.  Follow instructions from your health care  provider about cleaning your ears. Do not over-clean your ears.  Drink enough fluid to keep your urine clear or pale yellow. This will help to thin the earwax.  Keep all follow-up visits as told by your health care provider. If earwax builds up in your ears often or if you use hearing aids, consider seeing your health care provider for routine, preventive ear cleanings. Ask your health care provider how often you should schedule your cleanings.  If you have hearing aids, clean them according to instructions from the manufacturer and your health care provider. Contact a health care provider if:  You have ear pain.  You develop a fever.  You have blood, pus, or other fluid coming from your ear.  You have hearing loss.  You have ringing in your ears that does not go away.  Your symptoms do not improve with treatment.  You feel like the room is spinning (vertigo). Summary  Earwax can build up in the ear and cause discomfort or hearing loss.  The most common symptoms of this condition include reduced or muffled hearing and a feeling of fullness in the ear or feeling that the ear is plugged.  This condition may be diagnosed based on your symptoms, your medical history, and an ear exam.  This condition may be treated by using ear drops to soften the earwax or by having the earwax removed by a health care provider.  Do not put any   objects, including cotton swabs, into your ear. You can clean the opening of your ear canal with a washcloth or facial tissue. This information is not intended to replace advice given to you by your health care provider. Make sure you discuss any questions you have with your health care provider. Document Revised: 07/18/2017 Document Reviewed: 10/16/2016 Elsevier Patient Education  2020 [employer]  

## 2019-12-28 ENCOUNTER — Encounter: Payer: Self-pay | Admitting: Internal Medicine

## 2019-12-28 ENCOUNTER — Other Ambulatory Visit: Payer: Self-pay

## 2019-12-28 ENCOUNTER — Ambulatory Visit (INDEPENDENT_AMBULATORY_CARE_PROVIDER_SITE_OTHER): Payer: No Typology Code available for payment source | Admitting: Internal Medicine

## 2019-12-28 VITALS — BP 102/60 | HR 89 | Temp 98.6°F | Ht 61.25 in | Wt 163.1 lb

## 2019-12-28 DIAGNOSIS — M25522 Pain in left elbow: Secondary | ICD-10-CM | POA: Diagnosis not present

## 2019-12-28 DIAGNOSIS — Z0001 Encounter for general adult medical examination with abnormal findings: Secondary | ICD-10-CM | POA: Diagnosis not present

## 2019-12-28 DIAGNOSIS — M25422 Effusion, left elbow: Secondary | ICD-10-CM | POA: Diagnosis not present

## 2019-12-28 DIAGNOSIS — K51811 Other ulcerative colitis with rectal bleeding: Secondary | ICD-10-CM | POA: Diagnosis not present

## 2019-12-28 NOTE — Patient Instructions (Signed)

## 2019-12-28 NOTE — Progress Notes (Signed)
Subjective:    Patient ID: Gwendolyn Rice, female    DOB: [birth year], 29 y.o.   MRN: [id number]  HPI  Pt presents to the clinic today for her annual exam.   Ulcerative Colitis: Managed on Folic Acid, Azathioprine, and Mesalamine. She would like something for nausea. She needs a referral to a new GI doctor, insurance does not cover current GI doctor.  She c/o left elbow pain. She reports she hit it earlier today. She describes the pain as sharp and stabbing. She has noticed some swelling, but denies bruising or abrasions. She has not tried anything OTC for this.  Flu: neg Tetanus: 03/2016 Pap Smear: 01/2019 Dentist: biannually  Diet: She does eat meat. She consumes more veggies than fruits. She tries to avoid fried foods. She drinks mostly juices, Sprite, some water. Exercise: None  Review of Systems      Past Medical History:  Diagnosis Date  . Anginal pain ([place])    since childhoood, inflammation around the chest  . Chlamydia 12/2016, 05/2017  . Colitis   . Diarrhea   . 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 History of kidney stones     Current Outpatient Medications  Medication Sig Dispense Refill  . azaTHIOprine (IMURAN) 50 MG tablet Take 150 mg by mouth daily.    . folic acid (FOLVITE) 1 MG tablet Take 1 mg by mouth daily.    . medroxyPROGESTERone (DEPO-PROVERA) 150 MG/ML injection INJECT 1 ML (150 MG TOTAL) INTO THE MUSCLE ONCE FOR 1 DOSE. 1 mL 0  . mesalamine (APRISO) 0.375 g 24 hr capsule TAKE 4 CAPSULES BY MOUTH EVERY MORNING     No current facility-administered medications for this visit.    Allergies  Allergen Reactions  . Amoxicillin Swelling  . Ciprofloxacin Swelling  . Penicillins Other (See Comments)    Unknown reaction. as patient had a PCN reaction causing immediate rash, facial/tongue/throat swelling, SOB or lightheadedness with hypotension: unknown Has patient had a PCN reaction causing severe  rash involving mucus membranes or skin necrosis: unknown Has patient had a PCN reaction that required hospitalization: no Has patient had a PCN reaction occurring within the last 10 years: yes If all of the above answers are "NO", then may proceed with Cephalosporin use.   . Sulfa Antibiotics Swelling  . Flagyl [Metronidazole] Nausea And Vomiting  . Macrobid [Nitrofurantoin Monohyd Macro]     States she gets very sick    Family History  Problem Relation Age of Onset  . Heart attack Maternal Grandmother   . Cancer Paternal Aunt     Social History   Socioeconomic History  . Marital status: Single    Spouse name: n/a  . Number of children: 0  . Years of education: Not on file  . Highest education level: Not on file  Occupational History  . Occupation: assembly line    Comment: Nurse, adult  Tobacco Use  . Smoking status: Never Smoker  . Smokeless tobacco: Never Used  Substance and Sexual Activity  . Alcohol use: Yes    Alcohol/week: 1.0 standard drinks    Types: 1 Cans of beer per week    Comment: Rare  . Drug use: No  . Sexual activity: Yes    Partners: Male    Birth control/protection: Injection    Comment: 1st intercourse 74 yo-5 partners  Other Topics Concern  . Not on file  Social History Narrative   Lives with both parents.  She has a brother, 36 years older, who lives independently.   Social Determinants of Health   Financial Resource Strain:   . Difficulty of Paying Living Expenses:   Food Insecurity:   . Worried About Charity fundraiser in the Last Year:   . Arboriculturist in the Last Year:   Transportation Needs:   . Film/video editor (Medical):   Marland Kitchen Lack of Transportation (Non-Medical):   Physical Activity:   . Days of Exercise per Week:   . Minutes of Exercise per Session:   Stress:   . Feeling of Stress :   Social Connections:   . Frequency of Communication with Friends and Family:   . Frequency of Social Gatherings with Friends  and Family:   . Attends Religious Services:   . Active Member of Clubs or Organizations:   . Attends Archivist Meetings:   Marland Kitchen Marital Status:   Intimate Partner Violence:   . Fear of Current or Ex-Partner:   . Emotionally Abused:   Marland Kitchen Physically Abused:   . Sexually Abused:      Constitutional: Pt reports fatigue. Denies fever, malaise, headache or abrupt weight changes.  HEENT: Denies eye pain, eye redness, ear pain, ringing in the ears, wax buildup, runny nose, nasal congestion, bloody nose, or sore throat. Respiratory: Denies difficulty breathing, shortness of breath, cough or sputum production.   Cardiovascular: Denies chest pain, chest tightness, palpitations or swelling in the hands or feet.  Gastrointestinal: Pt reports intermittent abdominal pain, diarrhea. Denies bloating, constipation, or blood in the stool.  GU: Denies urgency, frequency, pain with urination, burning sensation, blood in urine, odor or discharge. Musculoskeletal: Pt reports left elbow pain and swelling. Denies decrease in range of motion, difficulty with gait, muscle pain.  Skin: Denies redness, rashes, lesions or ulcercations.  Neurological: Denies dizziness, difficulty with memory, difficulty with speech or problems with balance and coordination.  Psych: Denies anxiety, depression, SI/HI.  No other specific complaints in a complete review of systems (except as listed in HPI above).  Objective:   Physical Exam  BP 102/60 (BP Location: Right Arm, Patient Position: Sitting, Cuff Size: Normal)   Pulse 89   Temp 98.6 F (37 C) (Temporal)   Ht 5' 1.25" (1.556 m)   Wt 163 lb 1.9 oz (74 kg)   SpO2 97%   BMI 30.57 kg/m   Wt Readings from Last 3 Encounters:  11/15/19 159 lb (72.1 kg)  01/20/19 153 lb (69.4 kg)  01/13/18 141 lb (64 kg)    General: Appears her stated age, obese, in NAD. Skin: Warm, dry and intact. No rashes, lesions or ulcerations noted. HEENT: Head: normal shape and size;  Eyes: sclera white, no icterus, conjunctiva pink, PERRLA and EOMs intact;  Neck:  Neck supple, trachea midline. No masses, lumps or thyromegaly present.  Cardiovascular: Normal rate and rhythm. S1,S2 noted.  No murmur, rubs or gallops noted. No JVD or BLE edema.  Pulmonary/Chest: Normal effort and positive vesicular breath sounds. No respiratory distress. No wheezes, rales or ronchi noted.  Abdomen: Soft and nontender. Normal bowel sounds. No distention or masses noted. Liver, spleen and kidneys non palpable. Musculoskeletal: Unable to fully flex left elbow. Normal extension and rotation. 1+ left elbow joint swelling noted. Pain with palpation over the lateral and medial epicondyles. Strength 4/5 LUE, 5/5 RUE. No difficulty with gait.  Neurological: Alert and oriented. Cranial nerves II-XII grossly intact. Coordination normal.  Psychiatric: Tearful. Behavior is normal. Judgment and  thought content normal.     BMET    Component Value Date/Time   NA 137 12/09/2018 1556   NA 141 12/10/2017 1723   K 3.7 12/09/2018 1556   CL 103 12/09/2018 1556   CO2 26 12/09/2018 1556   GLUCOSE 76 12/09/2018 1556   BUN 14 12/09/2018 1556   BUN 11 12/10/2017 1723   CREATININE 0.68 12/09/2018 1556   CREATININE 0.83 12/30/2016 1621   CALCIUM 9.3 12/09/2018 1556   GFRNONAA 121 12/10/2017 1723   GFRAA 140 12/10/2017 1723    Lipid Panel  No results found for: CHOL, TRIG, HDL, CHOLHDL, VLDL, LDLCALC  CBC    Component Value Date/Time   WBC 8.7 12/09/2018 1556   RBC 3.81 (L) 12/09/2018 1556   HGB 12.2 12/09/2018 1556   HCT 36.2 12/09/2018 1556   PLT 387.0 12/09/2018 1556   MCV 95.1 12/09/2018 1556   MCV 87.4 12/17/2017 1713   MCH 28.6 12/17/2017 1713   MCH 31.6 05/10/2017 1032   MCHC 33.7 12/09/2018 1556   RDW 15.4 12/09/2018 1556   LYMPHSABS 2.2 12/09/2018 1556   MONOABS 0.7 12/09/2018 1556   EOSABS 0.5 12/09/2018 1556   BASOSABS 0.1 12/09/2018 1556    Hgb A1C No results found for:  HGBA1C          Assessment & Plan:  Preventative Health Maintenance:  Encouraged her to get a flu shot in the fall Tetanus UTD Pap smear UTD Encouraged her to consume a balanced diet and exercise regimen Advised her to see a dentist annually Will check CBC, CMET, TSH, Lipid and A1C  Left Elbow Pain and Swelling:  She declines xray today for financial reasons Encouraged ice therapy ACE wrap given- she did not want me to apply this  RTC in 1 year, sooner if needed Webb Silversmith, NP This visit occurred during the SARS-CoV-2 public health emergency.  Safety protocols were in place, including screening questions prior to the visit, additional usage of staff PPE, and extensive cleaning of exam room while observing appropriate contact time as indicated for disinfecting solutions.

## 2019-12-29 LAB — COMPREHENSIVE METABOLIC PANEL
ALT: 12 U/L (ref 0–35)
AST: 17 U/L (ref 0–37)
Albumin: 4.6 g/dL (ref 3.5–5.2)
Alkaline Phosphatase: 53 U/L (ref 39–117)
BUN: 16 mg/dL (ref 6–23)
CO2: 26 mEq/L (ref 19–32)
Calcium: 9.6 mg/dL (ref 8.4–10.5)
Chloride: 103 mEq/L (ref 96–112)
Creatinine, Ser: 0.64 mg/dL (ref 0.40–1.20)
GFR: 109.92 mL/min (ref >60.00)
Glucose, Bld: 92 mg/dL (ref 70–99)
Potassium: 3.7 mEq/L (ref 3.5–5.1)
Sodium: 138 mEq/L (ref 135–145)
Total Bilirubin: 0.3 mg/dL (ref 0.2–1.2)
Total Protein: 7.7 g/dL (ref 6.0–8.3)

## 2019-12-29 LAB — LIPID PANEL
Cholesterol: 171 mg/dL (ref 0–200)
HDL: 59.4 mg/dL (ref >39.00)
LDL Cholesterol: 103 mg/dL — ABNORMAL HIGH (ref 0–99)
NonHDL: 111.6
Total CHOL/HDL Ratio: 3
Triglycerides: 45 mg/dL (ref 0.0–149.0)
VLDL: 9 mg/dL (ref 0.0–40.0)

## 2019-12-29 LAB — CBC
HCT: 38 % (ref 36.0–46.0)
Hemoglobin: 13.1 g/dL (ref 12.0–15.0)
MCHC: 34.4 g/dL (ref 30.0–36.0)
MCV: 94.8 fl (ref 78.0–100.0)
Platelets: 374 10*3/uL (ref 150.0–400.0)
RBC: 4.01 Mil/uL (ref 3.87–5.11)
RDW: 14 % (ref 11.5–15.5)
WBC: 7.1 10*3/uL (ref 4.0–10.5)

## 2019-12-29 LAB — HEMOGLOBIN A1C: Hgb A1c MFr Bld: 5.4 % (ref 4.6–6.5)

## 2019-12-29 LAB — TSH: TSH: 1.84 u[IU]/mL (ref 0.35–4.50)

## 2019-12-29 MED ORDER — ONDANSETRON HCL 4 MG PO TABS: 4.0000 mg | ORAL_TABLET | Freq: Three times a day (TID) | ORAL | 0 refills | Status: DC | PRN

## 2019-12-29 NOTE — Assessment & Plan Note (Signed)
Stable on Folic Acid, Azathioprine and Mesalamine Referral to GI placed RX for Zofran 4 mg tid prn

## 2019-12-31 ENCOUNTER — Telehealth: Payer: Self-pay

## 2019-12-31 NOTE — Telephone Encounter (Signed)
Unable to leave a message at the pts cell number due to voicemail being full. 

## 2019-12-31 NOTE — Telephone Encounter (Signed)
All antinausea meds have sedating effects, phenergan worse than zofran. If that doesn't work, she can try Dramamine or Emetrol OTC

## 2019-12-31 NOTE — Telephone Encounter (Signed)
Patient contacted the office. She states she would like a different medication sent in for nausea - she states that the Zofran makes her very sleepy, and she is wondering what other options she has? She would like this sent in to [facility] on [address]

## 2020-01-04 ENCOUNTER — Telehealth: Payer: Self-pay | Admitting: Internal Medicine

## 2020-01-04 NOTE — Telephone Encounter (Signed)
Patient called today about lab results from physical on 5/11 She would like to know if the results have come back

## 2020-01-04 NOTE — Telephone Encounter (Signed)
Spoke to pt and she is aware of results... pt wants to know what she can do to help with her energy... pt did express that she does not drink much water and she said she used to take Vitamin B12 as she took in the past... Pt is aware that she should try to drink more water and that it soul be okay to restart B12...  Please advise

## 2020-01-05 NOTE — Telephone Encounter (Signed)
Ok to take B12 500 mcg daily, try to get 8 hours of sleep nightly, exercise- this will help increase energy levels.

## 2020-01-24 ENCOUNTER — Encounter: Payer: Self-pay | Admitting: Internal Medicine

## 2020-02-21 ENCOUNTER — Encounter: Payer: Self-pay | Admitting: Internal Medicine

## 2020-03-25 ENCOUNTER — Encounter: Payer: Self-pay | Admitting: Internal Medicine

## 2020-03-28 ENCOUNTER — Telehealth: Payer: Self-pay

## 2020-03-28 NOTE — Telephone Encounter (Signed)
Yes, you can always use our Same Day slots to combine

## 2020-03-28 NOTE — Telephone Encounter (Signed)
Can I use a same day and an open slot?

## 2020-03-28 NOTE — Telephone Encounter (Signed)
Pt called wanting an appt Late after 3pm for a knot in her neck. Out of town this week. I offered a 15 min slot on the 27th but then she stated she had another problem with her breasts feeling hard at times. I felt like that should be a 30 min appt. Please advise if I can use a same day with an appt after 3pm. Thanks

## 2020-03-28 NOTE — Telephone Encounter (Signed)
For multiple issues, has to be 30 min

## 2020-03-29 ENCOUNTER — Telehealth: Payer: Self-pay

## 2020-03-29 NOTE — Telephone Encounter (Signed)
Left a message on VM for pt to call the office and set up a 20mn spot later in the afternoon. If anyone asks, tell them there is a note about this in her chart.

## 2020-03-29 NOTE — Telephone Encounter (Signed)
Pt scheduled for 04/06/20 @ 4pm.

## 2020-03-29 NOTE — Telephone Encounter (Signed)
Received fax from pharmacy that PA needed for [place] fax back that [place] are not being done for these, patients are instructed to use discount cards. Asked pharmacy to contact patient about savings card to run instead of insurance

## 2020-04-06 ENCOUNTER — Other Ambulatory Visit: Payer: Self-pay

## 2020-04-06 ENCOUNTER — Ambulatory Visit (INDEPENDENT_AMBULATORY_CARE_PROVIDER_SITE_OTHER): Payer: No Typology Code available for payment source | Admitting: Internal Medicine

## 2020-04-06 ENCOUNTER — Encounter: Payer: Self-pay | Admitting: Internal Medicine

## 2020-04-06 VITALS — BP 110/74 | HR 68 | Temp 97.8°F | Ht 61.25 in | Wt 164.0 lb

## 2020-04-06 DIAGNOSIS — N6012 Diffuse cystic mastopathy of left breast: Secondary | ICD-10-CM

## 2020-04-06 DIAGNOSIS — N6011 Diffuse cystic mastopathy of right breast: Secondary | ICD-10-CM

## 2020-04-06 DIAGNOSIS — E65 Localized adiposity: Secondary | ICD-10-CM

## 2020-04-06 NOTE — Patient Instructions (Signed)
Fibrocystic Breast Changes  Fibrocystic breast changes are changes that can make your breasts swollen or painful. These changes happen when tiny sacs of fluid (cysts) form in the breast. This is a common condition. It does not mean that you have cancer. It usually happens because of hormone changes during a monthly period. Follow these instructions at home:  Check your breasts after every monthly period. If you do not have monthly periods, check your breasts on the first day of every month. Check for: ? Soreness. ? New swelling or puffiness. ? A change in breast size. ? A change in a lump that was already there.  Take over-the-counter and prescription medicines only as told by your doctor.  Wear a support or sports bra that fits well. Wear this support especially when you are exercising.  Avoid or have less caffeine, fat, and sugar in what you eat and drink as told by your doctor. Contact a doctor if:  You have fluid coming from your nipple, especially if the fluid has blood in it.  You have new lumps or bumps in your breast.  Your breast gets puffy, red, and painful.  You have changes in how your breast looks.  Your nipple looks flat or it sinks into your breast. Get help right away if:  Your breast turns red, and the redness is spreading. Summary  Fibrocystic breast changes are changes that can make your breasts swollen or painful.  This condition can happen when you have hormone changes during your monthly period.  With this condition, it is important to check your breasts after every monthly period. If you do not have monthly periods, check your breasts on the first day of every month. This information is not intended to replace advice given to you by your health care provider. Make sure you discuss any questions you have with your health care provider. Document Revised: 11/26/2018 Document Reviewed: 04/18/2016 Elsevier Patient Education  2020 [employer].

## 2020-04-06 NOTE — Progress Notes (Signed)
Subjective:    Patient ID: Gwendolyn Rice, female    DOB: Aug 14, 1991, 29 y.o.   MRN: [id number]  HPI  Pt presents to the clinic today with c/o a knot on her neck. She noticed this 2 weeks ago. The knot can be tender with looking upwards or with laying on her back. She has not noticed if it has gotten bigger in size. She denies any injury to the area that she is aware of. She has not taken anything OTC for this. She reports her father has a similar issue.  She also reports that her breasts feel "hard" at times. She notices this more around her monthly cycle. Her LMP was 04/01/20.  Review of Systems      Past Medical History:  Diagnosis Date  . Anginal pain ([place])    since childhoood, inflammation around the chest  . Chlamydia 12/2016, 05/2017  . Colitis   . Diarrhea   . 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 History of kidney stones     Current Outpatient Medications  Medication Sig Dispense Refill  . azaTHIOprine (IMURAN) 50 MG tablet Take 150 mg by mouth daily.    . folic acid (FOLVITE) 1 MG tablet Take 1 mg by mouth daily.    . mesalamine (APRISO) 0.375 g 24 hr capsule TAKE 4 CAPSULES BY MOUTH EVERY MORNING    . ondansetron (ZOFRAN) 4 MG tablet Take 1 tablet (4 mg total) by mouth every 8 (eight) hours as needed. 20 tablet 0   No current facility-administered medications for this visit.    Allergies  Allergen Reactions  . Amoxicillin Swelling  . Ciprofloxacin Swelling  . Penicillins Other (See Comments)    Unknown reaction. as patient had a PCN reaction causing immediate rash, facial/tongue/throat swelling, SOB or lightheadedness with hypotension: unknown Has patient had a PCN reaction causing severe rash involving mucus membranes or skin necrosis: unknown Has patient had a PCN reaction that required hospitalization: no Has patient had a PCN reaction occurring within the last 10 years: yes If all of the above answers  are "NO", then may proceed with Cephalosporin use.   . Sulfa Antibiotics Swelling  . Flagyl [Metronidazole] Nausea And Vomiting  . Macrobid [Nitrofurantoin Monohyd Macro]     States she gets very sick    Family History  Problem Relation Age of Onset  . Heart attack Maternal Grandmother   . Cancer Paternal Aunt     Social History   Socioeconomic History  . Marital status: Single    Spouse name: n/a  . Number of children: 0  . Years of education: Not on file  . Highest education level: Not on file  Occupational History  . Occupation: assembly line    Comment: Nurse, adult  Tobacco Use  . Smoking status: Never Smoker  . Smokeless tobacco: Never Used  Vaping Use  . Vaping Use: Never used  Substance and Sexual Activity  . Alcohol use: Yes    Alcohol/week: 1.0 standard drink    Types: 1 Cans of beer per week    Comment: Rare  . Drug use: No  . Sexual activity: Yes    Partners: Male    Birth control/protection: Injection    Comment: 1st intercourse 34 yo-5 partners  Other Topics Concern  . Not on file  Social History Narrative   Lives with both parents.   She has a brother, 74 years older, who lives independently.   Social  Determinants of Health   Financial Resource Strain:   . Difficulty of Paying Living Expenses: Not on file  Food Insecurity:   . Worried About Charity fundraiser in the Last Year: Not on file  . Ran Out of Food in the Last Year: Not on file  Transportation Needs:   . Lack of Transportation (Medical): Not on file  . Lack of Transportation (Non-Medical): Not on file  Physical Activity:   . Days of Exercise per Week: Not on file  . Minutes of Exercise per Session: Not on file  Stress:   . Feeling of Stress : Not on file  Social Connections:   . Frequency of Communication with Friends and Family: Not on file  . Frequency of Social Gatherings with Friends and Family: Not on file  . Attends Religious Services: Not on file  . Active Member  of Clubs or Organizations: Not on file  . Attends Archivist Meetings: Not on file  . Marital Status: Not on file  Intimate Partner Violence:   . Fear of Current or Ex-Partner: Not on file  . Emotionally Abused: Not on file  . Physically Abused: Not on file  . Sexually Abused: Not on file     Constitutional: Denies fever, malaise, fatigue, headache or abrupt weight changes.  Respiratory: Denies difficulty breathing, shortness of breath, cough or sputum production.   Cardiovascular: Denies chest pain, chest tightness, palpitations or swelling in the hands or feet.  Skin: Pt reports breast tenderness, mass of neck. Denies redness, rashes, lesions or ulcercations.   No other specific complaints in a complete review of systems (except as listed in HPI above).  Objective:   Physical Exam  BP 110/74 (BP Location: Left Arm, Patient Position: Sitting, Cuff Size: Normal)   Pulse 68   Temp 97.8 F (36.6 C) (Temporal)   Ht 5' 1.25" (1.556 m)   Wt 164 lb (74.4 kg)   LMP 04/01/2020   SpO2 96%   BMI 30.74 kg/m   Wt Readings from Last 3 Encounters:  12/28/19 163 lb 1.9 oz (74 kg)  11/15/19 159 lb (72.1 kg)  01/20/19 153 lb (69.4 kg)    General: Appears her stated age, obese, in NAD. Breast: Symmetrical, indented nipples bilaterally, no nipple discharge expressed. Fibrocystic changes bilaterally. No discrete masses on lumps noted. Cardiovascular: Normal rate and rhythm. S1,S2 noted.  No murmur, rubs or gallops noted.  Pulmonary/Chest: Normal effort and positive vesicular breath sounds. No respiratory distress. No wheezes, rales or ronchi noted.  Musculoskeletal: Normal flexion, extension and rotation of the cervical spine. No bony tenderness noted over the spine. Dorsal fat pad noted. Neurological: Alert and oriented.   BMET    Component Value Date/Time   NA 138 12/28/2019 1620   NA 141 12/10/2017 1723   K 3.7 12/28/2019 1620   CL 103 12/28/2019 1620   CO2 26 12/28/2019  1620   GLUCOSE 92 12/28/2019 1620   BUN 16 12/28/2019 1620   BUN 11 12/10/2017 1723   CREATININE 0.64 12/28/2019 1620   CREATININE 0.83 12/30/2016 1621   CALCIUM 9.6 12/28/2019 1620   GFRNONAA 121 12/10/2017 1723   GFRAA 140 12/10/2017 1723    Lipid Panel     Component Value Date/Time   CHOL 171 12/28/2019 1620   TRIG 45.0 12/28/2019 1620   HDL 59.40 12/28/2019 1620   CHOLHDL 3 12/28/2019 1620   VLDL 9.0 12/28/2019 1620   LDLCALC 103 (H) 12/28/2019 1620    CBC  Component Value Date/Time   WBC 7.1 12/28/2019 1620   RBC 4.01 12/28/2019 1620   HGB 13.1 12/28/2019 1620   HCT 38.0 12/28/2019 1620   PLT 374.0 12/28/2019 1620   MCV 94.8 12/28/2019 1620   MCV 87.4 12/17/2017 1713   MCH 28.6 12/17/2017 1713   MCH 31.6 05/10/2017 1032   MCHC 34.4 12/28/2019 1620   RDW 14.0 12/28/2019 1620   LYMPHSABS 2.2 12/09/2018 1556   MONOABS 0.7 12/09/2018 1556   EOSABS 0.5 12/09/2018 1556   BASOSABS 0.1 12/09/2018 1556    Hgb A1C Lab Results  Component Value Date   HGBA1C 5.4 12/28/2019            Assessment & Plan:   Fibrocystic Changes of Breast:  Advised her this can be more tender around the time of her menses as well as depending on caffeine use No indication for mammogram at this time Will monitor  Dorsal Fat Pad:  Cortisol level today  Will follow up after labs, return precautions discussed Webb Silversmith, NP This visit occurred during the SARS-CoV-2 public health emergency.  Safety protocols were in place, including screening questions prior to the visit, additional usage of staff PPE, and extensive cleaning of exam room while observing appropriate contact time as indicated for disinfecting solutions.

## 2020-04-07 ENCOUNTER — Encounter: Payer: Self-pay | Admitting: Internal Medicine

## 2020-04-07 LAB — CORTISOL: Cortisol, Plasma: 8.6 ug/dL

## 2020-08-02 ENCOUNTER — Ambulatory Visit (INDEPENDENT_AMBULATORY_CARE_PROVIDER_SITE_OTHER): Payer: No Typology Code available for payment source | Admitting: Nurse Practitioner

## 2020-08-02 ENCOUNTER — Other Ambulatory Visit: Payer: Self-pay

## 2020-08-02 ENCOUNTER — Encounter: Payer: Self-pay | Admitting: Nurse Practitioner

## 2020-08-02 VITALS — BP 122/80 | Ht 61.0 in | Wt 161.0 lb

## 2020-08-02 DIAGNOSIS — Z01419 Encounter for gynecological examination (general) (routine) without abnormal findings: Secondary | ICD-10-CM | POA: Diagnosis not present

## 2020-08-02 DIAGNOSIS — Z30011 Encounter for initial prescription of contraceptive pills: Secondary | ICD-10-CM | POA: Diagnosis not present

## 2020-08-02 DIAGNOSIS — B009 Herpesviral infection, unspecified: Secondary | ICD-10-CM

## 2020-08-02 DIAGNOSIS — N898 Other specified noninflammatory disorders of vagina: Secondary | ICD-10-CM | POA: Diagnosis not present

## 2020-08-02 DIAGNOSIS — B9689 Other specified bacterial agents as the cause of diseases classified elsewhere: Secondary | ICD-10-CM

## 2020-08-02 DIAGNOSIS — Z113 Encounter for screening for infections with a predominantly sexual mode of transmission: Secondary | ICD-10-CM

## 2020-08-02 DIAGNOSIS — N760 Acute vaginitis: Secondary | ICD-10-CM

## 2020-08-02 LAB — WET PREP FOR TRICH, YEAST, CLUE

## 2020-08-02 MED ORDER — TINIDAZOLE 500 MG PO TABS: 500.0000 mg | ORAL_TABLET | Freq: Two times a day (BID) | ORAL | 0 refills | Status: AC

## 2020-08-02 MED ORDER — VALACYCLOVIR HCL 500 MG PO TABS: 500.0000 mg | ORAL_TABLET | Freq: Two times a day (BID) | ORAL | 0 refills | Status: DC

## 2020-08-02 MED ORDER — DROSPIRENONE-ETHINYL ESTRADIOL 3-0.02 MG PO TABS: 1.0000 | ORAL_TABLET | Freq: Every day | ORAL | 3 refills | Status: DC

## 2020-08-02 NOTE — Progress Notes (Signed)
   Gwendolyn Rice 16-Jul-1991 [id number]   History:  29 y.o. G0 presents for annual exam. She complains of vaginal odor some days without itching or abnormal discharge. Normal pap history. Was on Depo Provera from 2018-2020 and was on this some time prior to this as well with a break in between. At last visit, discussion was had regarding black box warning for loss of calcium from bones with recommendations to switch to different form of contraception. Last injecton was June 2020. She did well on OCPs in the past. Sexually active with new partner. HSV-1, occasional outbreaks and having one today.   Gynecologic History Patient's last menstrual period was 07/18/2020. Period Cycle (Days): 28 Period Duration (Days): 5 Period Pattern: Regular Menstrual Flow: Moderate Menstrual Control: Maxi pad,Tampon Dysmenorrhea: (!) Mild Dysmenorrhea Symptoms: Cramping Contraception: none Last Pap: 01/20/2019. Results were: normal Last colonoscopy: 2019. Results were: normal  Past medical history, past surgical history, family history and social history were all reviewed and documented in the EPIC chart.  ROS:  A ROS was performed and pertinent positives and negatives are included.  Exam:  Vitals:   08/02/20 1602  BP: 122/80  Weight: 161 lb (73 kg)  Height: 5' 1"  (1.549 m)   Body mass index is 30.42 kg/m.  General appearance:  Normal Thyroid:  Symmetrical, normal in size, without palpable masses or nodularity. Respiratory  Auscultation:  Clear without wheezing or rhonchi Cardiovascular  Auscultation:  Regular rate, without rubs, murmurs or gallops  Edema/varicosities:  Not grossly evident Abdominal  Soft,nontender, without masses, guarding or rebound.  Liver/spleen:  No organomegaly noted  Hernia:  None appreciated  Skin  Inspection:  Grossly normal   Breasts: Examined lying and sitting.   Right: Without masses, retractions, discharge or axillary adenopathy.   Left: Without masses,  retractions, discharge or axillary adenopathy. Gentitourinary   Inguinal/mons:  Normal without inguinal adenopathy  External genitalia:  Normal  BUS/Urethra/Skene's glands:  Normal  Vagina:  Normal  Cervix:  Normal  Uterus:  Normal in size, shape and contour.  Midline and mobile  Adnexa/parametria:     Rt: Without masses or tenderness.   Lt: Without masses or tenderness.  Anus and perineum: Normal  Assessment/Plan:  29 y.o. G0 for annual exam.   Well female exam with routine gynecological exam - Education provided on SBEs, importance of preventative screenings, current guidelines, high calcium diet, regular exercise, and multivitamin daily. Labs with PCP.   Encounter for initial prescription of contraceptive pills - Plan: drospirenone-ethinyl estradiol (YAZ) 3-0.02 MG tablet. Discussed proper use and to start with next cycle. Backup contraception recommended until then and first 7 days after starting.   HSV-1 (herpes simplex virus 1) infection - Plan: valACYclovir (VALTREX) 500 MG tablet 3-5 days at first sign of outbreak.   Screen for STD (sexually transmitted disease) - Plan: C. trachomatis/N. gonorrhoeae RNA. Discussed safe sex practices and condom use until permanent partner.   Vaginal odor - Plan: WET PREP FOR TRICH, YEAST, CLUE  Bacterial vaginosis - Plan: tinidazole (TINDAMAX) 500 MG tablet twice daily for 2 days.   Screening for cervical cancer - normal pap history. Patient requested pap today.   Follow up in 1 year for annual.     Tamela Gammon [facility], 4:24 PM 08/02/2020

## 2020-08-02 NOTE — Patient Instructions (Signed)
Health Maintenance, Female Adopting a healthy lifestyle and getting preventive care are important in promoting health and wellness. Ask your health care provider about:  The right schedule for you to have regular tests and exams.  Things you can do on your own to prevent diseases and keep yourself healthy. What should I know about diet, weight, and exercise? Eat a healthy diet   Eat a diet that includes plenty of vegetables, fruits, low-fat dairy products, and lean protein.  Do not eat a lot of foods that are high in solid fats, added sugars, or sodium. Maintain a healthy weight Body mass index (BMI) is used to identify weight problems. It estimates body fat based on height and weight. Your health care provider can help determine your BMI and help you achieve or maintain a healthy weight. Get regular exercise Get regular exercise. This is one of the most important things you can do for your health. Most adults should:  Exercise for at least 150 minutes each week. The exercise should increase your heart rate and make you sweat (moderate-intensity exercise).  Do strengthening exercises at least twice a week. This is in addition to the moderate-intensity exercise.  Spend less time sitting. Even light physical activity can be beneficial. Watch cholesterol and blood lipids Have your blood tested for lipids and cholesterol at 29 years of age, then have this test every 5 years. Have your cholesterol levels checked more often if:  Your lipid or cholesterol levels are high.  You are older than 29 years of age.  You are at high risk for heart disease. What should I know about cancer screening? Depending on your health history and family history, you may need to have cancer screening at various ages. This may include screening for:  Breast cancer.  Cervical cancer.  Colorectal cancer.  Skin cancer.  Lung cancer. What should I know about heart disease, diabetes, and high blood  pressure? Blood pressure and heart disease  High blood pressure causes heart disease and increases the risk of stroke. This is more likely to develop in people who have high blood pressure readings, are of African descent, or are overweight.  Have your blood pressure checked: ? Every 3-5 years if you are 18-39 years of age. ? Every year if you are 40 years old or older. Diabetes Have regular diabetes screenings. This checks your fasting blood sugar level. Have the screening done:  Once every three years after age 40 if you are at a normal weight and have a low risk for diabetes.  More often and at a younger age if you are overweight or have a high risk for diabetes. What should I know about preventing infection? Hepatitis B If you have a higher risk for hepatitis B, you should be screened for this virus. Talk with your health care provider to find out if you are at risk for hepatitis B infection. Hepatitis C Testing is recommended for:  Everyone born from 1945 through 1965.  Anyone with known risk factors for hepatitis C. Sexually transmitted infections (STIs)  Get screened for STIs, including gonorrhea and chlamydia, if: ? You are sexually active and are younger than 29 years of age. ? You are older than 29 years of age and your health care provider tells you that you are at risk for this type of infection. ? Your sexual activity has changed since you were last screened, and you are at increased risk for chlamydia or gonorrhea. Ask your health care provider if   you are at risk.  Ask your health care provider about whether you are at high risk for HIV. Your health care provider may recommend a prescription medicine to help prevent HIV infection. If you choose to take medicine to prevent HIV, you should first get tested for HIV. You should then be tested every 3 months for as long as you are taking the medicine. Pregnancy  If you are about to stop having your period (premenopausal) and  you may become pregnant, seek counseling before you get pregnant.  Take 400 to 800 micrograms (mcg) of folic acid every day if you become pregnant.  Ask for birth control (contraception) if you want to prevent pregnancy. Osteoporosis and menopause Osteoporosis is a disease in which the bones lose minerals and strength with aging. This can result in bone fractures. If you are 65 years old or older, or if you are at risk for osteoporosis and fractures, ask your health care provider if you should:  Be screened for bone loss.  Take a calcium or vitamin D supplement to lower your risk of fractures.  Be given hormone replacement therapy (HRT) to treat symptoms of menopause. Follow these instructions at home: Lifestyle  Do not use any products that contain nicotine or tobacco, such as cigarettes, e-cigarettes, and chewing tobacco. If you need help quitting, ask your health care provider.  Do not use street drugs.  Do not share needles.  Ask your health care provider for help if you need support or information about quitting drugs. Alcohol use  Do not drink alcohol if: ? Your health care provider tells you not to drink. ? You are pregnant, may be pregnant, or are planning to become pregnant.  If you drink alcohol: ? Limit how much you use to 0-1 drink a day. ? Limit intake if you are breastfeeding.  Be aware of how much alcohol is in your drink. In the [place] one drink equals one 12 oz bottle of beer (355 mL), one 5 oz glass of wine (148 mL), or one 1 oz glass of hard liquor (44 mL). General instructions  Schedule regular health, dental, and eye exams.  Stay current with your vaccines.  Tell your health care provider if: ? You often feel depressed. ? You have ever been abused or do not feel safe at home. Summary  Adopting a healthy lifestyle and getting preventive care are important in promoting health and wellness.  Follow your health care provider's instructions about healthy  diet, exercising, and getting tested or screened for diseases.  Follow your health care provider's instructions on monitoring your cholesterol and blood pressure. This information is not intended to replace advice given to you by your health care provider. Make sure you discuss any questions you have with your health care provider. Document Revised: 07/29/2018 Document Reviewed: 07/29/2018 Elsevier Patient Education  2020 Elsevier Inc.  

## 2020-08-03 LAB — HM PAP SMEAR

## 2020-08-03 NOTE — Addendum Note (Signed)
Addended by: Lorine Bears on: 08/03/2020 10:01 AM   Modules accepted: Orders

## 2020-08-04 LAB — C. TRACHOMATIS/N. GONORRHOEAE RNA
C. trachomatis RNA, TMA: NOT DETECTED
N. gonorrhoeae RNA, TMA: NOT DETECTED

## 2020-08-04 LAB — PAP IG W/ RFLX HPV ASCU

## 2020-08-10 ENCOUNTER — Other Ambulatory Visit: Payer: Self-pay | Admitting: Nurse Practitioner

## 2020-08-10 DIAGNOSIS — B009 Herpesviral infection, unspecified: Secondary | ICD-10-CM

## 2020-08-28 ENCOUNTER — Encounter: Payer: Self-pay | Admitting: Obstetrics and Gynecology

## 2020-09-21 ENCOUNTER — Other Ambulatory Visit: Payer: Self-pay | Admitting: Nurse Practitioner

## 2020-09-21 DIAGNOSIS — B009 Herpesviral infection, unspecified: Secondary | ICD-10-CM

## 2020-09-21 MED ORDER — VALACYCLOVIR HCL 500 MG PO TABS: 500.0000 mg | ORAL_TABLET | Freq: Two times a day (BID) | ORAL | 1 refills | Status: DC

## 2020-09-26 ENCOUNTER — Telehealth: Payer: Self-pay

## 2020-09-26 NOTE — Telephone Encounter (Signed)
[name] - Client TELEPHONE ADVICE RECORD AccessNurse Patient Name: Gwendolyn Rice Gender: Female DOB: [birth year] Age: 30 Y 5 M 20 D Return Phone Number: [phone] (Primary) Address[place] [address] Client [facility] - Client Client Site [name] - Day Physician [name] - NP Contact Type Call Who Is Calling Patient / Member / Family / Caregiver Call Type Triage / Clinical Relationship To Patient Self Return Phone Number [phone] (Primary) Chief Complaint Toe Pain Reason for Call Symptomatic / Request for Health Information Initial Comment Caller states that her toenail is black and blue and painful. She is concerned that the toenail is thick and hanging on. Translation No Nurse Assessment Nurse: Laural Benes, RN, Clydie Braun Date/Time Lamount Cohen Time): 09/25/2020 4:44:41 PM Confirm and document reason for call. If symptomatic, describe symptoms. ---Caller states that right big toe is black and blue and kind of think. It is sensitive if you touch it. Some minor pain when walking. It has been this way for a while. Does the patient have any new or worsening symptoms? ---Yes Will a triage be completed? ---Yes Related visit to physician within the last 2 weeks? ---N/A Does the PT have any chronic conditions? (i.e. diabetes, asthma, this includes High risk factors for pregnancy, etc.) ---No Is the patient pregnant or possibly pregnant? (Ask all females between the ages of 29-55) ---No Is this a behavioral health or substance abuse call? ---No Guidelines Guideline Title Affirmed Question Affirmed Notes Nurse Date/Time (Eastern Time) Toe Pain [1] MODERATE pain (e.g., interferes with normal activities, limping) AND [2] present > 3 days [name] 09/25/2020 4:48:06 PM Disp. Time Lamount Cohen Time) Disposition Final User 09/25/2020 4:53:11 PM SEE PCP WITHIN 3 DAYS Yes Laural Benes,  RN, Clydie Braun PLEASE NOTE: All timestamps contained within this report are represented as [place] Standard Time. CONFIDENTIALTY NOTICE: This fax transmission is intended only for the addressee. It contains information that is legally privileged, confidential or otherwise protected from use or disclosure. If you are not the intended recipient, you are strictly prohibited from reviewing, disclosing, copying using or disseminating any of this information or taking any action in reliance on or regarding this information. If you have received this fax in error, please notify us immediately by telephone so that we can arrange for its return to [place]. Phone: [phone], Toll-Free: [phone], Fax: [phone] Page: 2 of 2 Call Id: [id number] Caller Disagree/Comply Comply Caller Understands Yes PreDisposition Did not know what to do Care Advice Given Per Guideline SEE PCP WITHIN 3 DAYS: * You need to be seen within 2 or 3 days. CALL BACK IF: * You become worse Comments User: [name], RN Date/Time Lamount Cohen Time): 09/25/2020 4:54:59 PM Caller states that she has an appointment with office this coming Wednesday. Referrals REFERRED TO PCP OFFICE

## 2020-09-26 NOTE — Telephone Encounter (Signed)
Per appt notes pt already has appt with Avie Echevaria NP on 09/27/20 at 4pm.

## 2020-09-26 NOTE — Telephone Encounter (Signed)
Will discuss at upcoming appt.

## 2020-09-27 ENCOUNTER — Encounter: Payer: Self-pay | Admitting: Internal Medicine

## 2020-09-27 ENCOUNTER — Other Ambulatory Visit: Payer: Self-pay

## 2020-09-27 ENCOUNTER — Ambulatory Visit: Payer: No Typology Code available for payment source | Admitting: Internal Medicine

## 2020-09-27 VITALS — BP 112/68 | HR 97 | Temp 98.1°F | Wt 158.0 lb

## 2020-09-27 DIAGNOSIS — B351 Tinea unguium: Secondary | ICD-10-CM | POA: Diagnosis not present

## 2020-09-27 NOTE — Progress Notes (Unsigned)
Subjective:    Patient ID: Gwendolyn Rice, female    DOB: 08-23-1990, 30 y.o.   MRN: [id number]  HPI  Patient presents to the clinic today with complaint of discoloration of her right great toenail.  She noticed this 1 year ago.  She reports the toenail is also loose.  She denies any trauma to the area.  She has been cleaning the area with peroxide.  Review of Systems      Past Medical History:  Diagnosis Date  . Anginal pain (HCC)    since childhoood, inflammation around the chest  . Chlamydia 12/2016, 05/2017  . Colitis   . Diarrhea   . 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 History of kidney stones     Current Outpatient Medications  Medication Sig Dispense Refill  . azaTHIOprine (IMURAN) 50 MG tablet Take 150 mg by mouth daily.    . drospirenone-ethinyl estradiol (YAZ) 3-0.02 MG tablet Take 1 tablet by mouth daily. 84 tablet 3  . folic acid (FOLVITE) 1 MG tablet Take 1 mg by mouth daily.    . mesalamine (APRISO) 0.375 g 24 hr capsule TAKE 4 CAPSULES BY MOUTH EVERY MORNING    . ondansetron (ZOFRAN) 4 MG tablet Take 1 tablet (4 mg total) by mouth every 8 (eight) hours as needed. 20 tablet 0  . valACYclovir (VALTREX) 500 MG tablet Take 1 tablet (500 mg total) by mouth 2 (two) times daily. For 3-5 days at sign of outbreak 30 tablet 1   No current facility-administered medications for this visit.    Allergies  Allergen Reactions  . Amoxicillin Swelling  . Ciprofloxacin Swelling  . Penicillins Other (See Comments)    Unknown reaction. as patient had a PCN reaction causing immediate rash, facial/tongue/throat swelling, SOB or lightheadedness with hypotension: unknown Has patient had a PCN reaction causing severe rash involving mucus membranes or skin necrosis: unknown Has patient had a PCN reaction that required hospitalization: no Has patient had a PCN reaction occurring within the last 10 years: yes If all of the  above answers are "NO", then may proceed with Cephalosporin use.   . Sulfa Antibiotics Swelling  . Flagyl [Metronidazole] Nausea And Vomiting  . Macrobid [Nitrofurantoin Monohyd Macro]     States she gets very sick    Family History  Problem Relation Age of Onset  . Heart attack Maternal Grandmother   . Cancer Paternal Aunt     Social History   Socioeconomic History  . Marital status: Single    Spouse name: n/a  . Number of children: 0  . Years of education: Not on file  . Highest education level: Not on file  Occupational History  . Occupation: assembly line    Comment: Soil scientist  Tobacco Use  . Smoking status: Never Smoker  . Smokeless tobacco: Never Used  Vaping Use  . Vaping Use: Never used  Substance and Sexual Activity  . Alcohol use: Yes    Alcohol/week: 1.0 standard drink    Types: 1 Cans of beer per week    Comment: Rare  . Drug use: No  . Sexual activity: Yes    Partners: Male    Birth control/protection: Injection, None    Comment: 1st intercourse 36 yo-5 partners  Other Topics Concern  . Not on file  Social History Narrative   Lives with both parents.   She has a brother, 9 years older, who lives independently.   Social Determinants  of Health   Financial Resource Strain: Not on file  Food Insecurity: Not on file  Transportation Needs: Not on file  Physical Activity: Not on file  Stress: Not on file  Social Connections: Not on file  Intimate Partner Violence: Not on file     Constitutional: Denies fever, malaise, fatigue, headache or abrupt weight changes.  Respiratory: Denies difficulty breathing, shortness of breath, cough or sputum production.   Cardiovascular: Denies chest pain, chest tightness, palpitations or  Skin: Patient reports discoloration of right great toenail.  Denies redness, rashes, lesions or ulcercations.   No other specific complaints in a complete review of systems (except as listed in HPI above).  Objective:    Physical Exam   BP 112/68   Pulse 97   Temp 98.1 F (36.7 C) (Temporal)   Wt 158 lb (71.7 kg)   SpO2 98%   BMI 29.85 kg/m   Wt Readings from Last 3 Encounters:  08/02/20 161 lb (73 kg)  04/06/20 164 lb (74.4 kg)  12/28/19 163 lb 1.9 oz (74 kg)    General: Appears her stated age, in NAD. Skin: Right great toenail discolored, thick and only attached at the base. Cardiovascular: Normal rate. Pulmonary/Chest: Normal effort. Neurological: Alert and oriented.   BMET    Component Value Date/Time   NA 138 12/28/2019 1620   NA 141 12/10/2017 1723   K 3.7 12/28/2019 1620   CL 103 12/28/2019 1620   CO2 26 12/28/2019 1620   GLUCOSE 92 12/28/2019 1620   BUN 16 12/28/2019 1620   BUN 11 12/10/2017 1723   CREATININE 0.64 12/28/2019 1620   CREATININE 0.83 12/30/2016 1621   CALCIUM 9.6 12/28/2019 1620   GFRNONAA 121 12/10/2017 1723   GFRAA 140 12/10/2017 1723    Lipid Panel     Component Value Date/Time   CHOL 171 12/28/2019 1620   TRIG 45.0 12/28/2019 1620   HDL 59.40 12/28/2019 1620   CHOLHDL 3 12/28/2019 1620   VLDL 9.0 12/28/2019 1620   LDLCALC 103 (H) 12/28/2019 1620    CBC    Component Value Date/Time   WBC 7.1 12/28/2019 1620   RBC 4.01 12/28/2019 1620   HGB 13.1 12/28/2019 1620   HCT 38.0 12/28/2019 1620   PLT 374.0 12/28/2019 1620   MCV 94.8 12/28/2019 1620   MCV 87.4 12/17/2017 1713   MCH 28.6 12/17/2017 1713   MCH 31.6 05/10/2017 1032   MCHC 34.4 12/28/2019 1620   RDW 14.0 12/28/2019 1620   LYMPHSABS 2.2 12/09/2018 1556   MONOABS 0.7 12/09/2018 1556   EOSABS 0.5 12/09/2018 1556   BASOSABS 0.1 12/09/2018 1556    Hgb A1C Lab Results  Component Value Date   HGBA1C 5.4 12/28/2019           Assessment & Plan:   Toenail Fungus:  Given how loose the nail is, would recommend referral to podiatry for further evaluation and treatment, possible removal of the toenail  Return precautions discussed

## 2020-09-27 NOTE — Patient Instructions (Signed)
Preventing Toenail Fungus from Recurring   Sanitize your shoes with Mycomist spray or a similar shoe sanitizer spray.  Follow the instructions on the bottle and dry them outside in the sun or with a hairdryer.  We also recommend repeating the sanitization once weekly in shoes you wear most often.   Throw away any shoes you have worn a significant amount without socks-fungus thrives in a warm moist environment and you want to avoid re-infection after your laser procedure   Bleach your socks with regular or color safe bleach   Change your socks regularly to keep your feet clean and dry (especially if you have sweaty feet)-if sweaty feet are a problem, let your doctor know-there is a great lotion that helps with this problem.   Clean your toenail clippers with alcohol before you use them if you do your own toenails and make sure to replace Emory boards and orange sticks regularly   If you get regular pedicures, bring your own instruments or go to a spa that sterilizes their instruments in an autoclave.

## 2020-09-28 ENCOUNTER — Encounter: Payer: Self-pay | Admitting: Internal Medicine

## 2020-09-28 ENCOUNTER — Other Ambulatory Visit: Payer: Self-pay | Admitting: Nurse Practitioner

## 2020-09-28 DIAGNOSIS — B009 Herpesviral infection, unspecified: Secondary | ICD-10-CM

## 2020-10-04 ENCOUNTER — Encounter: Payer: Self-pay | Admitting: Podiatry

## 2020-10-04 ENCOUNTER — Other Ambulatory Visit: Payer: Self-pay

## 2020-10-04 ENCOUNTER — Ambulatory Visit (INDEPENDENT_AMBULATORY_CARE_PROVIDER_SITE_OTHER): Payer: No Typology Code available for payment source | Admitting: Podiatry

## 2020-10-04 ENCOUNTER — Encounter: Payer: Self-pay | Admitting: *Deleted

## 2020-10-04 DIAGNOSIS — B351 Tinea unguium: Secondary | ICD-10-CM | POA: Diagnosis not present

## 2020-10-04 DIAGNOSIS — L601 Onycholysis: Secondary | ICD-10-CM | POA: Diagnosis not present

## 2020-10-04 DIAGNOSIS — L602 Onychogryphosis: Secondary | ICD-10-CM

## 2020-10-04 DIAGNOSIS — L603 Nail dystrophy: Secondary | ICD-10-CM

## 2020-10-04 NOTE — Patient Instructions (Addendum)
Soak Instructions    THE DAY AFTER THE PROCEDURE  Place 1/4 cup of epsom salts or Betadine in a quart of warm tap water.  Submerge your foot or feet with outer bandage intact for the initial soak; this will allow the bandage to become moist and wet for easy lift off.  Once you remove your bandage, continue to soak in the solution for 20 minutes.  This soak should be done twice a day.  Next, remove your foot or feet from solution, blot dry the affected area and cover.  You may use a band aid large enough to cover the area or use gauze and tape.  Apply other medications to the area as directed by the doctor such as polysporin neosporin.  IF YOUR SKIN BECOMES IRRITATED WHILE USING THESE INSTRUCTIONS, IT IS OKAY TO SWITCH TO  WHITE VINEGAR AND WATER. Or you may use antibacterial soap and water to keep the toe clean  Monitor for any signs/symptoms of infection. Call the office immediately if any occur or go directly to the emergency room. Call with any questions/concerns.

## 2020-10-06 ENCOUNTER — Telehealth: Payer: Self-pay

## 2020-10-06 MED ORDER — TERBINAFINE HCL 250 MG PO TABS: 250.0000 mg | ORAL_TABLET | Freq: Every day | ORAL | 0 refills | Status: DC

## 2020-10-06 NOTE — Telephone Encounter (Signed)
I sent it in another encounter. Thanks!

## 2020-10-06 NOTE — Telephone Encounter (Signed)
I was going to resend the script, but the note is not done yet   Please advise

## 2020-10-06 NOTE — Telephone Encounter (Signed)
Pt called regarding medication from 10/04/20 visit. She would like them sent to [facility] [address].

## 2020-10-08 ENCOUNTER — Encounter: Payer: Self-pay | Admitting: Podiatry

## 2020-10-08 NOTE — Progress Notes (Signed)
  Subjective:  Patient ID: Gwendolyn Rice, female    DOB: [birth year],  MRN: [id number]  Chief Complaint  Patient presents with  . Nail Problem    Patient presents today for nail dystrophy, thick painful nail coming away from the nail bed    30 y.o. female presents with the above complaint. History confirmed with patient.  The nail has been this way for 3 to 4 months.  It is painful and is coming loose from the nail bed.  Thick and hard to cut.  Objective:  Physical Exam: warm, good capillary refill, no trophic changes or ulcerative lesions, normal DP and PT pulses and normal sensory exam.   Right Foot: Dystrophic nail with onycholysis and onychogryphosis  Assessment:   1. Onychomycosis   2. Nail dystrophy   3. Onychogryphosis   4. Onycholysis      Plan:  Patient was evaluated and treated and all questions answered.  Onychomycosis -Educated on etiology of nail fungus. -Nail is loose and lysed from the nail bed.  Recommended avulsion today which we performed temporarily.  We will let her regrow with terbinafine administration -Rx for terbinafine x90 days sent to pharmacy     Return in about 3 months (around 01/01/2021).

## 2020-10-12 ENCOUNTER — Other Ambulatory Visit: Payer: Self-pay | Admitting: Nurse Practitioner

## 2020-10-12 DIAGNOSIS — B009 Herpesviral infection, unspecified: Secondary | ICD-10-CM

## 2020-10-12 NOTE — Telephone Encounter (Signed)
This was the second time pharmacy sent this request so I called them. This was prescribed 09/21/20 for #30 x 1 refill and they said patient had picked up both having picked up the refill this past week. I called patient and spoke with her to clarify how she is taking. She understood directions were take 2 daily for 3-5 days with outbreak and she said she had two outbreaks in one month.  She is fine now and said she picked the Refill up to have on hand and right now does not need refill.  Pharmacy sent this so she would have refill on file.

## 2020-10-20 ENCOUNTER — Telehealth: Payer: Self-pay | Admitting: *Deleted

## 2020-10-20 NOTE — Telephone Encounter (Signed)
I see where you called.  How can I help you?  "I just want to know how long I'm supposed to soak my toe.  I forgot to ask him when I was there."  You are to soak it as long as you have drainage and continue to do the dressings.  "Dressings, what's that?"  A bandaid or whatever you use to wrap your toe.  "I took the bandage off last night and left it off today.  It looks good."  I'm glad to hear.  If there's not any drainage you can discontinue the soaks and dressings.

## 2020-12-21 ENCOUNTER — Telehealth: Payer: Self-pay

## 2020-12-21 NOTE — Telephone Encounter (Signed)
Patient called c/o vaginal odor. Rec office visit to assess. Staff message sent to [facility] to call and arrange visit.

## 2020-12-26 ENCOUNTER — Other Ambulatory Visit: Payer: Self-pay

## 2020-12-26 ENCOUNTER — Ambulatory Visit: Payer: No Typology Code available for payment source | Admitting: Nurse Practitioner

## 2020-12-26 ENCOUNTER — Encounter: Payer: Self-pay | Admitting: Nurse Practitioner

## 2020-12-26 VITALS — BP 114/70 | HR 93 | Resp 16 | Wt 169.0 lb

## 2020-12-26 DIAGNOSIS — B9689 Other specified bacterial agents as the cause of diseases classified elsewhere: Secondary | ICD-10-CM

## 2020-12-26 DIAGNOSIS — N760 Acute vaginitis: Secondary | ICD-10-CM | POA: Diagnosis not present

## 2020-12-26 DIAGNOSIS — N898 Other specified noninflammatory disorders of vagina: Secondary | ICD-10-CM

## 2020-12-26 LAB — WET PREP FOR TRICH, YEAST, CLUE

## 2020-12-26 MED ORDER — METRONIDAZOLE 0.75 % VA GEL: 1.0000 | Freq: Every day | VAGINAL | 0 refills | Status: DC

## 2020-12-26 NOTE — Progress Notes (Signed)
   Acute Office Visit  Subjective:    Patient ID: Gwendolyn Rice, female    DOB: 02-26-1991, 30 y.o.   MRN: [id number]   HPI 30 y.o. presents today for vaginal odor that comes and goes. Denies itching or discharge. Sexually active with boyfriend.   Review of Systems  Constitutional: Negative.   Genitourinary: Negative for vaginal discharge.       Vaginal odor      Objective:    Physical Exam Constitutional:      Appearance: Normal appearance.  Genitourinary:    General: Normal vulva.     Vagina: Normal.     Cervix: Normal.     BP 114/70   Pulse 93   Resp 16   Wt 169 lb (76.7 kg)   LMP 12/01/2020 (Exact Date)   BMI 31.93 kg/m  Wt Readings from Last 3 Encounters:  12/26/20 169 lb (76.7 kg)  09/27/20 158 lb (71.7 kg)  08/02/20 161 lb (73 kg)   Wet prep + clue cells    Assessment & Plan:   Problem List Items Addressed This Visit   None   Visit Diagnoses    Bacterial vaginosis    -  Primary   Relevant Medications   metroNIDAZOLE (METROGEL) 0.75 % vaginal gel   Vaginal odor       Relevant Orders   WET PREP FOR TRICH, YEAST, CLUE     Plan: Wet prep positive for clue cells. Metronidazole allergy of nausea and vomiting listed on profile so we will try vaginal gel to avoid GI side effects. If symptoms worsen or do not improve she will return to office. She is agreeable to plan.      [place], 4:56 PM 12/26/2020

## 2020-12-27 ENCOUNTER — Other Ambulatory Visit: Payer: Self-pay | Admitting: Nurse Practitioner

## 2020-12-27 DIAGNOSIS — B9689 Other specified bacterial agents as the cause of diseases classified elsewhere: Secondary | ICD-10-CM

## 2020-12-27 NOTE — Telephone Encounter (Signed)
Tiffany note on Rx from pharmacy said "Alternative Requested:PATIENT REQUESTS TABLET, NOT VAGINAL GEL" I called patient to confirm and she does want the pill form. Patient said she doesn't remember having any GI issues. Patient said "last time" the pill did fine. I think patient thought you were going to prescribe tinidazole like you did at the 08/02/20 visit.  Please advise

## 2020-12-27 NOTE — Telephone Encounter (Signed)
Switched to Tinidazole 500 mg twice daily x 5 days.

## 2020-12-28 NOTE — Telephone Encounter (Signed)
Patient informed. 

## 2021-01-01 ENCOUNTER — Other Ambulatory Visit: Payer: Self-pay

## 2021-01-01 ENCOUNTER — Ambulatory Visit: Payer: No Typology Code available for payment source | Admitting: Podiatry

## 2021-01-01 ENCOUNTER — Encounter: Payer: Self-pay | Admitting: Podiatry

## 2021-01-01 DIAGNOSIS — L601 Onycholysis: Secondary | ICD-10-CM

## 2021-01-01 DIAGNOSIS — B351 Tinea unguium: Secondary | ICD-10-CM | POA: Diagnosis not present

## 2021-01-01 DIAGNOSIS — L603 Nail dystrophy: Secondary | ICD-10-CM | POA: Diagnosis not present

## 2021-01-01 DIAGNOSIS — L602 Onychogryphosis: Secondary | ICD-10-CM

## 2021-01-01 MED ORDER — TERBINAFINE HCL 250 MG PO TABS: 250.0000 mg | ORAL_TABLET | Freq: Every day | ORAL | 0 refills | Status: AC

## 2021-01-01 NOTE — Progress Notes (Signed)
  Subjective:  Patient ID: Gwendolyn Rice, female    DOB: 22-May-1991,  MRN: [id number]  Chief Complaint  Patient presents with  . Nail Problem    "I can't tell any difference since taking the medication"    29 y.o. female returns for follow-up.  I asked her if she took the terbinafine she says she only got a 1 month supply of it and did not take the whole 90 days  Objective:  Physical Exam: warm, good capillary refill, no trophic changes or ulcerative lesions, normal DP and PT pulses and normal sensory exam.   Nail is regrowing and appears to be well attached to nail bed  Assessment:   1. Onychomycosis   2. Nail dystrophy   3. Onychogryphosis   4. Onycholysis      Plan:  Patient was evaluated and treated and all questions answered.  Onychomycosis -Should allow to continue to grow.  I checked the last prescription was for 90 days I am not sure why the pharmacy did not dispense the entire supply.  Possibly a quantity limit.  Sent another 60-day prescription she will finish the rest of this while the nail continues to grow out.  Return as needed.  If the nail continues to be bothersome permanent total nail avulsion would be the only treatment I can offer     Return if symptoms worsen or fail to improve.

## 2021-02-12 ENCOUNTER — Ambulatory Visit: Payer: No Typology Code available for payment source | Admitting: Internal Medicine

## 2021-02-12 ENCOUNTER — Encounter: Payer: Self-pay | Admitting: Internal Medicine

## 2021-02-12 ENCOUNTER — Other Ambulatory Visit: Payer: Self-pay

## 2021-02-12 VITALS — BP 114/70 | HR 110 | Temp 98.2°F | Wt 169.0 lb

## 2021-02-12 DIAGNOSIS — Z566 Other physical and mental strain related to work: Secondary | ICD-10-CM

## 2021-02-12 DIAGNOSIS — R5383 Other fatigue: Secondary | ICD-10-CM | POA: Diagnosis not present

## 2021-02-12 DIAGNOSIS — R002 Palpitations: Secondary | ICD-10-CM

## 2021-02-12 DIAGNOSIS — R531 Weakness: Secondary | ICD-10-CM

## 2021-02-12 DIAGNOSIS — R1013 Epigastric pain: Secondary | ICD-10-CM

## 2021-02-12 DIAGNOSIS — R112 Nausea with vomiting, unspecified: Secondary | ICD-10-CM

## 2021-02-12 DIAGNOSIS — R072 Precordial pain: Secondary | ICD-10-CM

## 2021-02-12 DIAGNOSIS — E6609 Other obesity due to excess calories: Secondary | ICD-10-CM | POA: Insufficient documentation

## 2021-02-12 DIAGNOSIS — Z6831 Body mass index (BMI) 31.0-31.9, adult: Secondary | ICD-10-CM

## 2021-02-12 DIAGNOSIS — R0602 Shortness of breath: Secondary | ICD-10-CM

## 2021-02-12 MED ORDER — OMEPRAZOLE 20 MG PO CPDR: 20.0000 mg | DELAYED_RELEASE_CAPSULE | Freq: Every day | ORAL | 2 refills | Status: DC

## 2021-02-12 MED ORDER — ONDANSETRON 4 MG PO TBDP: 4.0000 mg | ORAL_TABLET | Freq: Three times a day (TID) | ORAL | 0 refills | Status: DC | PRN

## 2021-02-12 NOTE — Patient Instructions (Signed)
Gastritis, Adult  Gastritis is swelling (inflammation) of the stomach. Gastritis can develop quickly (acute). It can also develop slowly over time (chronic). It is important to get help for this condition. If you do not get help, your stomach can bleed, and you can get sores (ulcers) in your stomach. What are the causes? This condition may be caused by: Germs that get to your stomach. Drinking too much alcohol. Medicines you are taking. Too much acid in the stomach. A disease of the intestines or stomach. Stress. An allergic reaction. Crohn's disease. Some cancer treatments (radiation). Sometimes the cause of this condition is not known. What are the signs or symptoms? Symptoms of this condition include: Pain in your stomach. A burning feeling in your stomach. Feeling sick to your stomach (nauseous). Throwing up (vomiting). Feeling too full after you eat. Weight loss. Bad breath. Throwing up blood. Blood in your poop (stool). How is this diagnosed? This condition may be diagnosed with: Your medical history and symptoms. A physical exam. Tests. These can include: Blood tests. Stool tests. A procedure to look inside your stomach (upper endoscopy). A test in which a sample of tissue is taken for testing (biopsy). How is this treated? Treatment for this condition depends on what caused it. You may be given: Antibiotic medicine, if your condition was caused by germs. H2 blockers and similar medicines, if your condition was caused by too much acid. Follow these instructions at home: Medicines Take over-the-counter and prescription medicines only as told by your doctor. If you were prescribed an antibiotic medicine, take it as told by your doctor. Do not stop taking it even if you start to feel better. Eating and drinking  Eat small meals often, instead of large meals. Avoid foods and drinks that make your symptoms worse. Drink enough fluid to keep your pee (urine) pale  yellow.  Alcohol use Do not drink alcohol if: Your doctor tells you not to drink. You are pregnant, may be pregnant, or are planning to become pregnant. If you drink alcohol: Limit your use to: 0-1 drink a day for women. 0-2 drinks a day for men. Be aware of how much alcohol is in your drink. In the [place] one drink equals one 12 oz bottle of beer (355 mL), one 5 oz glass of wine (148 mL), or one 1 oz glass of hard liquor (44 mL). General instructions Talk with your doctor about ways to manage stress. You can exercise or do deep breathing, meditation, or yoga. Do not smoke or use products that have nicotine or tobacco. If you need help quitting, ask your doctor. Keep all follow-up visits as told by your doctor. This is important. Contact a doctor if: Your symptoms get worse. Your symptoms go away and then come back. Get help right away if: You throw up blood or something that looks like coffee grounds. You have black or dark red poop. You throw up any time you try to drink fluids. Your stomach pain gets worse. You have a fever. You do not feel better after one week. Summary Gastritis is swelling (inflammation) of the stomach. You must get help for this condition. If you do not get help, your stomach can bleed, and you can get sores (ulcers). This condition is diagnosed with medical history, physical exam, or tests. You can be treated with medicines for germs or medicines to block too much acid in your stomach. This information is not intended to replace advice given to you by your health care  provider. Make sure you discuss any questions you have with your healthcare provider. Document Revised: 12/23/2017 Document Reviewed: 12/23/2017 Elsevier Patient Education  [place].

## 2021-02-12 NOTE — Progress Notes (Signed)
Subjective:    Patient ID: Gwendolyn Rice, female    DOB: Nov 09, 1990, 30 y.o.   MRN: [id number]  HPI  Pt presents to the clinic today with c/o fatigue, generalized weakness, palpitations, substernal chest pain which she describes as burning with associated shortness of breath, intermittent nausea and vomiting. She reports these symptoms have been going on, but seem to be occurring more frequently. She feels like they are being triggered by stress at work. She denies headache, dizziness, visual changes, syncope, constipation or diarrhea. She reports she has taken Tylenol, ASA and drinks Sprite which seems to help her symptoms.   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     folic acid (FOLVITE) 1 MG tablet Take 1 mg by mouth daily.     mesalamine (APRISO) 0.375 g 24 hr capsule TAKE 4 CAPSULES BY MOUTH EVERY MORNING     terbinafine (LAMISIL) 250 MG tablet Take 1 tablet (250 mg total) by mouth daily. 60 tablet 0   valACYclovir (VALTREX) 500 MG tablet Take 1 tablet (500 mg total) by mouth 2 (two) times daily. For 3-5 days at sign of outbreak 30 tablet 1   No current facility-administered medications for this visit.    Allergies  Allergen Reactions   Amoxicillin Swelling   Ciprofloxacin Swelling   Penicillins Other (See Comments)    Unknown reaction. as patient had a PCN reaction causing immediate rash, facial/tongue/throat swelling, SOB or lightheadedness with hypotension: unknown Has patient had a PCN reaction causing severe rash involving mucus membranes or skin necrosis: unknown Has  patient had a PCN reaction that required hospitalization: no Has patient had a PCN reaction occurring within the last 10 years: yes If all of the above answers are "NO", then may proceed with Cephalosporin use.    Sulfa Antibiotics Swelling   Flagyl [Metronidazole] Nausea And Vomiting   Macrobid [Nitrofurantoin Monohyd Macro]     States she gets very sick    Family History  Problem Relation Age of Onset   Heart attack Maternal Grandmother    Cancer Paternal Aunt     Social History   Socioeconomic History   Marital status: Single    Spouse name: n/a   Number of children: 0   Years of education: Not on file   Highest education level: Not on file  Occupational History   Occupation: assembly line    Comment: Nurse, adult  Tobacco Use   Smoking status: Never   Smokeless tobacco: Never  Vaping Use   Vaping Use: Never used  Substance and Sexual Activity   Alcohol use: Yes    Alcohol/week: 0.0 standard drinks    Comment: Rare   Drug use: Never   Sexual activity: Yes    Partners: Male    Birth control/protection: None  Other Topics Concern   Not on file  Social History Narrative   Lives with both parents.   She has a brother, 51 years older, who lives independently.   Social Determinants of Health   Financial Resource Strain: Not on file  Food Insecurity: Not on file  Transportation Needs: Not on file  Physical Activity: Not on file  Stress: Not on file  Social Connections: Not on file  Intimate Partner Violence: Not on file     Constitutional: Pt reports fatigue. Denies fever, malaise, headache or abrupt weight changes.  HEENT: Denies eye pain, eye redness, ear pain, ringing in the ears, wax buildup, runny nose, nasal congestion, bloody nose, or sore throat. Respiratory: Pt reports intermittent shortness of breath. Denies difficulty breathing, cough or sputum production.   Cardiovascular: Pt reports palpitations, chest pain. Denies chest tightness, or  swelling in the hands or feet.  Gastrointestinal: Pt reports epigastric pain, nausea and vomiting. Denies  bloating, constipation, diarrhea or blood in the stool.  GU: Denies urgency, frequency, pain with urination, burning sensation, blood in urine, odor or discharge. Musculoskeletal: Denies decrease in range of motion, difficulty with gait, muscle pain or joint pain and swelling.  Skin: Denies redness, rashes, lesions or ulcercations.  Neurological: Denies dizziness, difficulty with memory, difficulty with speech or problems with balance and coordination.  Psych: Pt reports stress. Denies anxiety, depression, SI/HI.  No other specific complaints in a complete review of systems (except as listed in HPI above).  Objective:   Physical Exam   BP 114/70 (BP Location: Left Arm, Patient Position: Sitting, Cuff Size: Large)   Pulse (!) 110   Temp 98.2 F (36.8 C) (Temporal)   Wt 169 lb (76.7 kg)   SpO2 98%   BMI 31.93 kg/m  Wt Readings from Last 3 Encounters:  02/12/21 169 lb (76.7 kg)  12/26/20 169 lb (76.7 kg)  09/27/20 158 lb (71.7 kg)    General: Appears her stated age, obese, in NAD. Skin: Warm, dry and intact. No rashes noted. HEENT: Head: normal shape and size; Eyes: sclera white and EOMs intact; Ears: Tm's gray and intact, normal light reflex; Nose: mucosa pink and moist, septum midline; Throat/Mouth: Teeth present, mucosa pink and moist, no exudate, lesions or ulcerations noted.  Neck:  Neck supple, trachea midline. No masses, lumps or thyromegaly present.  Cardiovascular: Tachycardic with normal rhythm. S1,S2 noted.  No murmur, rubs or gallops noted.  Pulmonary/Chest: Normal effort and positive vesicular breath sounds. No respiratory distress. No wheezes, rales or ronchi noted.  Abdomen: Soft and tender in the epigastric region. Normal bowel sounds. No distention or masses noted. Liver, spleen and kidneys non palpable. Musculoskeletal:  No difficulty with gait.  Neurological:  Alert and oriented.  Psychiatric: Mood and affect normal. Behavior is normal. Judgment and thought content normal.     BMET    Component Value Date/Time   NA 138 12/28/2019 1620   NA 141 12/10/2017 1723   K 3.7 12/28/2019 1620   CL 103 12/28/2019 1620   CO2 26 12/28/2019 1620   GLUCOSE 92 12/28/2019 1620   BUN 16 12/28/2019 1620   BUN 11 12/10/2017 1723   CREATININE 0.64 12/28/2019 1620   CREATININE 0.83 12/30/2016 1621   CALCIUM 9.6 12/28/2019 1620   GFRNONAA 121 12/10/2017 1723   GFRAA 140 12/10/2017 1723    Lipid Panel     Component Value Date/Time   CHOL 171 12/28/2019 1620   TRIG 45.0 12/28/2019 1620   HDL 59.40 12/28/2019 1620   CHOLHDL 3 12/28/2019 1620   VLDL 9.0 12/28/2019 1620   LDLCALC 103 (H) 12/28/2019 1620    CBC    Component Value Date/Time   WBC 7.1 12/28/2019 1620   RBC 4.01 12/28/2019 1620  HGB 13.1 12/28/2019 1620   HCT 38.0 12/28/2019 1620   PLT 374.0 12/28/2019 1620   MCV 94.8 12/28/2019 1620   MCV 87.4 12/17/2017 1713   MCH 28.6 12/17/2017 1713   MCH 31.6 05/10/2017 1032   MCHC 34.4 12/28/2019 1620   RDW 14.0 12/28/2019 1620   LYMPHSABS 2.2 12/09/2018 1556   MONOABS 0.7 12/09/2018 1556   EOSABS 0.5 12/09/2018 1556   BASOSABS 0.1 12/09/2018 1556    Hgb A1C Lab Results  Component Value Date   HGBA1C 5.4 12/28/2019           Assessment & Plan:  Fatigue, Generalized Weakness, Substernal Chest Pain, Shortness of Breath, Epigastric Pain, Nause and Vomiting due to Stress at Work:  Indication for ECG: substernal chest pain Interpretation of ECG: Tachycardic with normal rhythm Comparison of ECG: None Will check CMET, TSH, MG and Hpylori breath test RX for Omeprazole 20 mg daily RX for Zofran 4 mg TID prn  Will follow up after labs, return precautions discussed   Webb Silversmith, NP  This visit occurred during the SARS-CoV-2 public health emergency.  Safety protocols were in place, including screening questions prior to the  visit, additional usage of staff PPE, and extensive cleaning of exam room while observing appropriate contact time as indicated for disinfecting solutions.

## 2021-02-12 NOTE — Assessment & Plan Note (Signed)
Encouraged diet and exercise for weight loss ?

## 2021-02-13 LAB — COMPLETE METABOLIC PANEL WITH GFR
AG Ratio: 1.5 (calc) (ref 1.0–2.5)
ALT: 15 U/L (ref 6–29)
AST: 18 U/L (ref 10–30)
Albumin: 4.5 g/dL (ref 3.6–5.1)
Alkaline phosphatase (APISO): 56 U/L (ref 31–125)
BUN: 11 mg/dL (ref 7–25)
CO2: 25 mmol/L (ref 20–32)
Calcium: 10 mg/dL (ref 8.6–10.2)
Chloride: 107 mmol/L (ref 98–110)
Creat: 0.61 mg/dL (ref 0.50–1.10)
GFR, Est African American: 142 mL/min/{1.73_m2} (ref >=60)
GFR, Est Non African American: 123 mL/min/{1.73_m2} (ref >=60)
Globulin: 3 g/dL (calc) (ref 1.9–3.7)
Glucose, Bld: 94 mg/dL (ref 65–99)
Potassium: 3.7 mmol/L (ref 3.5–5.3)
Sodium: 141 mmol/L (ref 135–146)
Total Bilirubin: 0.3 mg/dL (ref 0.2–1.2)
Total Protein: 7.5 g/dL (ref 6.1–8.1)

## 2021-02-13 LAB — TSH: TSH: 1.65 mIU/L

## 2021-02-13 LAB — H. PYLORI BREATH TEST: H. pylori Breath Test: NOT DETECTED

## 2021-02-13 LAB — MAGNESIUM: Magnesium: 1.9 mg/dL (ref 1.5–2.5)

## 2021-02-26 ENCOUNTER — Encounter: Payer: Self-pay | Admitting: Internal Medicine

## 2021-02-27 MED ORDER — ONDANSETRON 4 MG PO TBDP: 4.0000 mg | ORAL_TABLET | Freq: Three times a day (TID) | ORAL | 0 refills | Status: DC | PRN

## 2021-03-20 ENCOUNTER — Ambulatory Visit: Payer: No Typology Code available for payment source | Admitting: Nurse Practitioner

## 2021-03-20 ENCOUNTER — Other Ambulatory Visit: Payer: Self-pay

## 2021-03-20 VITALS — BP 108/72 | HR 80 | Resp 20 | Wt 172.2 lb

## 2021-03-20 DIAGNOSIS — Z3201 Encounter for pregnancy test, result positive: Secondary | ICD-10-CM

## 2021-03-20 DIAGNOSIS — N926 Irregular menstruation, unspecified: Secondary | ICD-10-CM | POA: Diagnosis not present

## 2021-03-20 DIAGNOSIS — Z3490 Encounter for supervision of normal pregnancy, unspecified, unspecified trimester: Secondary | ICD-10-CM

## 2021-03-20 LAB — PREGNANCY, URINE: Preg Test, Ur: POSITIVE — AB

## 2021-03-20 NOTE — Progress Notes (Signed)
   Acute Office Visit  Subjective:    Patient ID: Gwendolyn Rice, female    DOB: 1990-11-20, 29 y.o.   MRN: [id number]   HPI 30 y.o. presents today for positive home UPT. LMP in May or June, patient unsure of exact date. Happy with pregnancy and has lots of support with boyfriend and parents. She has had some nausea and fatigue but it is tolerable.    Review of Systems  Constitutional:  Positive for fatigue.  Gastrointestinal:  Positive for nausea.  Genitourinary: Negative.       Objective:    Physical Exam Constitutional:      Appearance: Normal appearance.    BP 108/72 (BP Location: Right Arm)   Pulse 80   Resp 20   Wt 172 lb 3.2 oz (78.1 kg)   LMP  (Approximate) Comment: May or June of 2022  BMI 32.54 kg/m  Wt Readings from Last 3 Encounters:  03/20/21 172 lb 3.2 oz (78.1 kg)  02/12/21 169 lb (76.7 kg)  12/26/20 169 lb (76.7 kg)        Assessment & Plan:   Problem List Items Addressed This Visit   None Visit Diagnoses     Pregnancy with gestation of unknown duration    -  Primary   Relevant Orders   US OB Transvaginal   Missed menses       Relevant Orders   Pregnancy, urine (Completed)      Plan: UPT positive. Discussed safe pregnancy behaviors and recommend starting prenatal vitamin. Provided her with a list of local OB providers and encouraged to establish soon. She would like to have viability ultrasound done here so we will schedule this. Warning signs discussed and she is aware to go to ER if she experiences abdominal pain with bleeding. All questions answered.      [place], 4:54 PM 03/20/2021

## 2021-03-21 ENCOUNTER — Encounter: Payer: Self-pay | Admitting: Nurse Practitioner

## 2021-04-03 NOTE — Progress Notes (Signed)
GYNECOLOGY  VISIT   HPI: 30 y.o.   Single  Caucasian  female   G0P0000 with No LMP recorded (lmp unknown).   here for viability ultrasound.    Husband present for the visit today.  Works in a factory.  Feels like she is getting dehydrated.  Asking for a note for work modifications.   She will be seen at [facility] for her obstetric care.  GYNECOLOGIC HISTORY: No LMP recorded (lmp unknown). Contraception:  none Menopausal hormone therapy:  n/a Last mammogram:  n/a Last pap smear: 08-03-20         OB History     Gravida  0   Para  0   Term  0   Preterm  0   AB  0   Living  0      SAB  0   IAB  0   Ectopic  0   Multiple  0   Live Births  0              Patient Active Problem List   Diagnosis Date Noted   Class 1 obesity due to excess calories with serious comorbidity and body mass index (BMI) of 31.0 to 31.9 in adult 02/12/2021    Past Medical History:  Diagnosis Date   Anginal pain ([place])    since childhoood, inflammation around the chest   Chlamydia 12/2016, 05/2017   Colitis    Diarrhea    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 Ectocervical and endocervical margins positive for LGSIL    Current Outpatient Medications  Medication Sig Dispense Refill   azaTHIOprine (IMURAN) 50 MG tablet Take 150 mg by mouth daily.     Cetirizine HCl (ZYRTEC PO) Take 1 tablet by mouth.     folic acid (FOLVITE) 1 MG tablet Take 1 mg by mouth daily.     omeprazole (PRILOSEC) 20 MG capsule Take 1 capsule (20 mg total) by mouth daily. 30 capsule 2   Prenatal MV & Min w/FA-DHA (PRENATAL GUMMIES) 0.18-25 MG CHEW Chew 1 tablet by mouth daily.     Probiotic Product (PROBIOTIC PO) Take 1 tablet by  mouth.     valACYclovir (VALTREX) 500 MG tablet Take 1 tablet (500 mg total) by mouth 2 (two) times daily. For 3-5 days at sign of outbreak 30 tablet 1   No current facility-administered medications for this visit.     ALLERGIES: Amoxicillin, Ciprofloxacin, Penicillins, Sulfa antibiotics, Flagyl [metronidazole], and Macrobid [nitrofurantoin monohyd macro]  Family History  Problem Relation Age of Onset   Heart attack Maternal Grandmother    Cancer Paternal Aunt     Social History   Socioeconomic History   Marital status: Single    Spouse name: n/a   Number of children: 0   Years of education: Not on file   Highest education level: Not on file  Occupational History   Occupation: assembly line    Comment: Nurse, adult  Tobacco Use   Smoking status: Never   Smokeless tobacco: Never  Vaping Use   Vaping Use: Never used  Substance and Sexual Activity   Alcohol use: Yes    Alcohol/week: 0.0 standard drinks    Comment: Rare   Drug use: Never   Sexual  activity: Yes    Partners: Male    Birth control/protection: None  Other Topics Concern   Not on file  Social History Narrative   Lives with both parents.   She has a brother, 30 years older, who lives independently.   Social Determinants of Health   Financial Resource Strain: Not on file  Food Insecurity: Not on file  Transportation Needs: Not on file  Physical Activity: Not on file  Stress: Not on file  Social Connections: Not on file  Intimate Partner Violence: Not on file    Review of Systems  All other systems reviewed and are negative.  PHYSICAL EXAMINATION:    BP 100/68   Pulse 87   Wt 172 lb (78 kg)   LMP  (LMP Unknown)   SpO2 99%   BMI 32.50 kg/m     General appearance: alert, cooperative and appears stated age  OB US Viable IUP.  10+4 weeks.   [place] 10/28/21. FH 171 beats per minute.   Cervix long and closed.  Resolving left CL cyst.  Adnexa normal.  No free fluid.   ASSESSMENT  Viable  IUP.   PLAN  We reviewed her OB US findings.  I reviewed her medications and recommend she discuss the Imuran with her OB team.  Up to date indicates is may be used for maintenance therapy for colitis during pregnancy.  She will schedule with her OB team. FU after delivery.    An After Visit Summary was printed and given to the patient.  14 min  total time was spent for this patient encounter, including preparation, face-to-face counseling with the patient, coordination of care, and documentation of the encounter.

## 2021-04-05 ENCOUNTER — Ambulatory Visit (INDEPENDENT_AMBULATORY_CARE_PROVIDER_SITE_OTHER): Payer: No Typology Code available for payment source | Admitting: Obstetrics and Gynecology

## 2021-04-05 ENCOUNTER — Other Ambulatory Visit: Payer: Self-pay

## 2021-04-05 ENCOUNTER — Other Ambulatory Visit: Payer: Self-pay | Admitting: Nurse Practitioner

## 2021-04-05 ENCOUNTER — Encounter: Payer: Self-pay | Admitting: Obstetrics and Gynecology

## 2021-04-05 ENCOUNTER — Other Ambulatory Visit (INDEPENDENT_AMBULATORY_CARE_PROVIDER_SITE_OTHER): Payer: No Typology Code available for payment source

## 2021-04-05 VITALS — BP 100/68 | HR 87 | Wt 172.0 lb

## 2021-04-05 DIAGNOSIS — O3680X1 Pregnancy with inconclusive fetal viability, fetus 1: Secondary | ICD-10-CM | POA: Diagnosis not present

## 2021-04-05 DIAGNOSIS — Z3490 Encounter for supervision of normal pregnancy, unspecified, unspecified trimester: Secondary | ICD-10-CM

## 2021-04-05 DIAGNOSIS — Z3A10 10 weeks gestation of pregnancy: Secondary | ICD-10-CM | POA: Diagnosis not present

## 2021-04-10 ENCOUNTER — Other Ambulatory Visit: Payer: No Typology Code available for payment source

## 2021-04-10 ENCOUNTER — Other Ambulatory Visit: Payer: No Typology Code available for payment source | Admitting: Obstetrics and Gynecology

## 2021-04-14 ENCOUNTER — Other Ambulatory Visit: Payer: Self-pay | Admitting: Internal Medicine

## 2021-04-14 NOTE — Telephone Encounter (Signed)
last RF 02/12/21 #30 2 RF

## 2021-05-02 ENCOUNTER — Telehealth: Payer: Self-pay

## 2021-05-02 NOTE — Telephone Encounter (Signed)
Patient called asking if Tiffany could write her a letter stating she is pregnant.  I asked her about her NOB appt and that is next Friday 05/11/21. Upon discussion she would not pick the letter up from our office until 05/11/21 when she is in the building for her her NOB visit.  I recommended to her that she just ask her OB for that note when she sees them next Friday and they can provide it. She agreed.

## 2021-05-05 ENCOUNTER — Other Ambulatory Visit: Payer: Self-pay | Admitting: Internal Medicine

## 2021-05-11 LAB — OB RESULTS CONSOLE ABO/RH
RH Type: POSITIVE
RH Type: POSITIVE

## 2021-05-11 LAB — OB RESULTS CONSOLE ANTIBODY SCREEN
Antibody Screen: NEGATIVE
Antibody Screen: NEGATIVE

## 2021-05-11 LAB — OB RESULTS CONSOLE HIV ANTIBODY (ROUTINE TESTING)
HIV: NONREACTIVE
HIV: NONREACTIVE

## 2021-05-11 LAB — OB RESULTS CONSOLE RUBELLA ANTIBODY, IGM
Rubella: NON-IMMUNE/NOT IMMUNE
Rubella: NON-IMMUNE/NOT IMMUNE

## 2021-05-11 LAB — OB RESULTS CONSOLE HEPATITIS B SURFACE ANTIGEN
Hepatitis B Surface Ag: NEGATIVE
Hepatitis B Surface Ag: NEGATIVE

## 2021-05-11 LAB — OB RESULTS CONSOLE GC/CHLAMYDIA
Chlamydia: NEGATIVE
Chlamydia: NEGATIVE
Gonorrhea: NEGATIVE
Gonorrhea: NEGATIVE

## 2021-05-11 LAB — OB RESULTS CONSOLE RPR: RPR: NONREACTIVE

## 2021-05-11 LAB — HEPATITIS C ANTIBODY: HCV Ab: NEGATIVE

## 2021-08-06 DIAGNOSIS — Z3480 Encounter for supervision of other normal pregnancy, unspecified trimester: Secondary | ICD-10-CM | POA: Diagnosis not present

## 2021-08-10 DIAGNOSIS — O99810 Abnormal glucose complicating pregnancy: Secondary | ICD-10-CM | POA: Diagnosis not present

## 2021-08-19 NOTE — L&D Delivery Note (Signed)
Patient was C/C/+1 and pushed for approx 3hr 15 minutes with epidural.    ?NSVD  female infant, Apgars 8/9, weight pending. ?The patient had small 2nd degree laceration repaired with 2-0 vicryl. ?Fundus was firm. ?EBL was expected amount, but due to some persistent trickling and small clots removed, TXA administered prophylactically. ?Placenta was delivered intact. ?Vagina was clear. ? ?Delayed cord clamping done for 30-60 seconds while warming baby. ?Baby was vigorous and doing skin to skin with mother. ? ?Gwendolyn Rice  ?

## 2021-08-23 DIAGNOSIS — Z369 Encounter for antenatal screening, unspecified: Secondary | ICD-10-CM | POA: Diagnosis not present

## 2021-08-28 DIAGNOSIS — O24415 Gestational diabetes mellitus in pregnancy, controlled by oral hypoglycemic drugs: Secondary | ICD-10-CM | POA: Diagnosis not present

## 2021-08-28 DIAGNOSIS — Z3A31 31 weeks gestation of pregnancy: Secondary | ICD-10-CM | POA: Diagnosis not present

## 2021-09-07 DIAGNOSIS — O99019 Anemia complicating pregnancy, unspecified trimester: Secondary | ICD-10-CM | POA: Diagnosis not present

## 2021-09-07 DIAGNOSIS — O99013 Anemia complicating pregnancy, third trimester: Secondary | ICD-10-CM | POA: Diagnosis not present

## 2021-09-07 DIAGNOSIS — O24415 Gestational diabetes mellitus in pregnancy, controlled by oral hypoglycemic drugs: Secondary | ICD-10-CM | POA: Diagnosis not present

## 2021-09-12 DIAGNOSIS — Z3A33 33 weeks gestation of pregnancy: Secondary | ICD-10-CM | POA: Diagnosis not present

## 2021-09-12 DIAGNOSIS — O24415 Gestational diabetes mellitus in pregnancy, controlled by oral hypoglycemic drugs: Secondary | ICD-10-CM | POA: Diagnosis not present

## 2021-09-12 DIAGNOSIS — O99013 Anemia complicating pregnancy, third trimester: Secondary | ICD-10-CM | POA: Diagnosis not present

## 2021-09-21 DIAGNOSIS — O24415 Gestational diabetes mellitus in pregnancy, controlled by oral hypoglycemic drugs: Secondary | ICD-10-CM | POA: Diagnosis not present

## 2021-09-28 DIAGNOSIS — Z3A35 35 weeks gestation of pregnancy: Secondary | ICD-10-CM | POA: Diagnosis not present

## 2021-09-28 DIAGNOSIS — O24414 Gestational diabetes mellitus in pregnancy, insulin controlled: Secondary | ICD-10-CM | POA: Diagnosis not present

## 2021-10-01 ENCOUNTER — Telehealth (INDEPENDENT_AMBULATORY_CARE_PROVIDER_SITE_OTHER): Payer: Self-pay | Admitting: Internal Medicine

## 2021-10-01 ENCOUNTER — Encounter: Payer: Self-pay | Admitting: Internal Medicine

## 2021-10-01 DIAGNOSIS — J069 Acute upper respiratory infection, unspecified: Secondary | ICD-10-CM | POA: Diagnosis not present

## 2021-10-01 DIAGNOSIS — Z3A36 36 weeks gestation of pregnancy: Secondary | ICD-10-CM | POA: Diagnosis not present

## 2021-10-01 DIAGNOSIS — Z3480 Encounter for supervision of other normal pregnancy, unspecified trimester: Secondary | ICD-10-CM | POA: Diagnosis not present

## 2021-10-01 DIAGNOSIS — Z113 Encounter for screening for infections with a predominantly sexual mode of transmission: Secondary | ICD-10-CM | POA: Diagnosis not present

## 2021-10-01 DIAGNOSIS — O24414 Gestational diabetes mellitus in pregnancy, insulin controlled: Secondary | ICD-10-CM | POA: Diagnosis not present

## 2021-10-01 LAB — OB RESULTS CONSOLE GBS: GBS: NEGATIVE

## 2021-10-01 MED ORDER — PROMETHAZINE-DM 6.25-15 MG/5ML PO SYRP: 5.0000 mL | ORAL_SOLUTION | Freq: Four times a day (QID) | ORAL | 0 refills | Status: DC | PRN

## 2021-10-01 NOTE — Progress Notes (Signed)
Virtual Visit via Video Note  I connected with Gwendolyn Rice on 10/01/21 at 11:20 AM EST by a video enabled telemedicine application and verified that I am speaking with the correct person using two identifiers.  Location: Patient: Home Provider: Office  Persons participating in this video call: Webb Silversmith, NP and Briditte Lasure   I discussed the limitations of evaluation and management by telemedicine and the availability of in person appointments. The patient expressed understanding and agreed to proceed.  History of Present Illness:  Patient reports headache, runny nose, nasal congestion and cough.  She reports started 1 week ago.  The headache is located in her forehead.  She describes the pain as pressure.  She denies dizziness or visual changes.  She is blowing clear/yellow mucus out of her nose.  The cough is productive of clear/yellow mucus.  She denies current sore throat, shortness of breath, nausea, vomiting or diarrhea.  She denies fever, chills or body aches.  She has tried Tylenol Cold and Flu OTC with minimal relief of symptoms.  She has had sick contacts diagnosed with a head cold.  She has not had known exposure to COVID or flu that she is aware of.  She has not taken a home COVID test.  She has had her flu vaccine but did not get a COVID-vaccine.  She is currently [redacted] weeks pregnant.   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     Cetirizine HCl (ZYRTEC PO) Take 1 tablet by mouth.     folic acid (FOLVITE) 1 MG tablet Take 1 mg by mouth daily.     omeprazole (PRILOSEC) 20 MG capsule TAKE 1  CAPSULE BY MOUTH EVERY DAY 90 capsule 1   Prenatal MV & Min w/FA-DHA (PRENATAL GUMMIES) 0.18-25 MG CHEW Chew 1 tablet by mouth daily.     Probiotic Product (PROBIOTIC PO) Take 1 tablet by mouth.     valACYclovir (VALTREX) 500 MG tablet Take 1 tablet (500 mg total) by mouth 2 (two) times daily. For 3-5 days at sign of outbreak 30 tablet 1   No current facility-administered medications for this visit.    Allergies  Allergen Reactions   Amoxicillin Swelling   Ciprofloxacin Swelling   Penicillins Other (See Comments)    Unknown reaction. as patient had a PCN reaction causing immediate rash, facial/tongue/throat swelling, SOB or lightheadedness with hypotension: unknown Has patient had a PCN reaction causing severe rash involving mucus membranes or skin necrosis: unknown Has patient had a PCN reaction that required hospitalization: no Has patient had a PCN reaction occurring within the last 10 years: yes If all of the above answers are "NO", then may proceed with Cephalosporin use.    Sulfa Antibiotics Swelling   Flagyl [Metronidazole] Nausea And Vomiting   Macrobid [Nitrofurantoin Monohyd Macro]     States she gets very sick    Family History  Problem Relation Age of Onset   Heart attack Maternal Grandmother    Cancer Paternal Aunt     Social History   Socioeconomic History   Marital status: Single  Spouse name: n/a   Number of children: 0   Years of education: Not on file   Highest education level: Not on file  Occupational History   Occupation: assembly line    Comment: Nurse, adult  Tobacco Use   Smoking status: Never   Smokeless tobacco: Never  Vaping Use   Vaping Use: Never used  Substance and Sexual Activity   Alcohol use: Yes    Alcohol/week: 0.0 standard drinks    Comment: Rare   Drug use: Never   Sexual activity: Yes    Partners: Male    Birth control/protection: None  Other Topics Concern   Not on file  Social History Narrative   Lives with  both parents.   She has a brother, 70 years older, who lives independently.   Social Determinants of Health   Financial Resource Strain: Not on file  Food Insecurity: Not on file  Transportation Needs: Not on file  Physical Activity: Not on file  Stress: Not on file  Social Connections: Not on file  Intimate Partner Violence: Not on file     Constitutional: Patient reports headache.  Denies fever, malaise, fatigue, or abrupt weight changes.  HEENT: Patient reports runny nose, nasal congestion.  Denies eye pain, eye redness, ear pain, ringing in the ears, wax buildup, bloody nose, or sore throat. Respiratory: Patient reports cough.  Denies difficulty breathing, shortness of breath,  or sputum production.   Cardiovascular: Denies chest pain, chest tightness, palpitations or swelling in the hands or feet.  Gastrointestinal: Denies abdominal pain, bloating, constipation, diarrhea or blood in the stool.   No other specific complaints in a complete review of systems (except as listed in HPI above).  Observations/Objective:  LMP  (LMP Unknown)  Wt Readings from Last 3 Encounters:  04/05/21 172 lb (78 kg)  03/20/21 172 lb 3.2 oz (78.1 kg)  02/12/21 169 lb (76.7 kg)    General: Appears her stated age,  in NAD. HEENT: Head: normal shape and size; Nose: Congestion noted; Throat/Mouth: Hoarseness noted Pulmonary/Chest: Normal effort.  Wet cough noted.  No respiratory distress.   Neurological: Alert and oriented.   BMET    Component Value Date/Time   NA 141 02/12/2021 1147   NA 141 12/10/2017 1723   K 3.7 02/12/2021 1147   CL 107 02/12/2021 1147   CO2 25 02/12/2021 1147   GLUCOSE 94 02/12/2021 1147   BUN 11 02/12/2021 1147   BUN 11 12/10/2017 1723   CREATININE 0.61 02/12/2021 1147   CALCIUM 10.0 02/12/2021 1147   GFRNONAA 123 02/12/2021 1147   GFRAA 142 02/12/2021 1147    Lipid Panel     Component Value Date/Time   CHOL 171 12/28/2019 1620   TRIG 45.0 12/28/2019 1620    HDL 59.40 12/28/2019 1620   CHOLHDL 3 12/28/2019 1620   VLDL 9.0 12/28/2019 1620   LDLCALC 103 (H) 12/28/2019 1620    CBC    Component Value Date/Time   WBC 7.1 12/28/2019 1620   RBC 4.01 12/28/2019 1620   HGB 13.1 12/28/2019 1620   HCT 38.0 12/28/2019 1620   PLT 374.0 12/28/2019 1620   MCV 94.8 12/28/2019 1620   MCV 87.4 12/17/2017 1713   MCH 28.6 12/17/2017 1713   MCH 31.6 05/10/2017 1032   MCHC 34.4 12/28/2019 1620   RDW 14.0 12/28/2019 1620   LYMPHSABS 2.2 12/09/2018 1556   MONOABS 0.7 12/09/2018 1556   EOSABS 0.5 12/09/2018 1556   BASOSABS 0.1 12/09/2018 1556  Hgb A1C Lab Results  Component Value Date   HGBA1C 5.4 12/28/2019         Assessment and Plan:  Viral URI with Cough:  Advised her since she is pregnant I would recommend that she take a COVID test Encourage rest and fluids Start Zyrtec and Flonase OTC Rx for Promethazine-DM cough syrup-use sparingly, sedation caution given We will consider antibiotics if symptoms persist to later this week  Return precautions discussed  Follow Up Instructions:    I discussed the assessment and treatment plan with the patient. The patient was provided an opportunity to ask questions and all were answered. The patient agreed with the plan and demonstrated an understanding of the instructions.   The patient was advised to call back or seek an in-person evaluation if the symptoms worsen or if the condition fails to improve as anticipated.   Webb Silversmith, NP

## 2021-10-01 NOTE — Patient Instructions (Signed)

## 2021-10-03 DIAGNOSIS — O24415 Gestational diabetes mellitus in pregnancy, controlled by oral hypoglycemic drugs: Secondary | ICD-10-CM | POA: Diagnosis not present

## 2021-10-08 ENCOUNTER — Other Ambulatory Visit: Payer: Self-pay | Admitting: Obstetrics and Gynecology

## 2021-10-08 ENCOUNTER — Observation Stay
Admission: EM | Admit: 2021-10-08 | Discharge: 2021-10-08 | Disposition: A | Discharge status: Home / Self Care | Payer: BC Managed Care – PPO | Attending: Obstetrics and Gynecology | Admitting: Obstetrics and Gynecology

## 2021-10-08 ENCOUNTER — Other Ambulatory Visit: Payer: Self-pay

## 2021-10-08 ENCOUNTER — Encounter: Payer: Self-pay | Admitting: Obstetrics and Gynecology

## 2021-10-08 DIAGNOSIS — O26893 Other specified pregnancy related conditions, third trimester: Secondary | ICD-10-CM | POA: Diagnosis not present

## 2021-10-08 DIAGNOSIS — R109 Unspecified abdominal pain: Secondary | ICD-10-CM | POA: Diagnosis not present

## 2021-10-08 DIAGNOSIS — R102 Pelvic and perineal pain unspecified side: Secondary | ICD-10-CM

## 2021-10-08 DIAGNOSIS — Z3A37 37 weeks gestation of pregnancy: Secondary | ICD-10-CM | POA: Insufficient documentation

## 2021-10-08 DIAGNOSIS — O99891 Other specified diseases and conditions complicating pregnancy: Secondary | ICD-10-CM | POA: Insufficient documentation

## 2021-10-08 DIAGNOSIS — R52 Pain, unspecified: Secondary | ICD-10-CM | POA: Diagnosis not present

## 2021-10-08 DIAGNOSIS — O4703 False labor before 37 completed weeks of gestation, third trimester: Secondary | ICD-10-CM | POA: Diagnosis not present

## 2021-10-08 DIAGNOSIS — O479 False labor, unspecified: Secondary | ICD-10-CM | POA: Diagnosis not present

## 2021-10-08 DIAGNOSIS — O24415 Gestational diabetes mellitus in pregnancy, controlled by oral hypoglycemic drugs: Secondary | ICD-10-CM | POA: Insufficient documentation

## 2021-10-08 DIAGNOSIS — O24419 Gestational diabetes mellitus in pregnancy, unspecified control: Secondary | ICD-10-CM

## 2021-10-08 HISTORY — DX: Gestational diabetes mellitus in pregnancy, unspecified control: O24.419

## 2021-10-08 LAB — URINALYSIS, COMPLETE (UACMP) WITH MICROSCOPIC
Bilirubin Urine: NEGATIVE
Glucose, UA: NEGATIVE mg/dL
Hgb urine dipstick: NEGATIVE
Ketones, ur: 80 mg/dL — AB
Nitrite: NEGATIVE
Protein, ur: 100 mg/dL — AB
Specific Gravity, Urine: 1.029 (ref 1.005–1.030)
pH: 5 (ref 5.0–8.0)

## 2021-10-08 LAB — RUPTURE OF MEMBRANE (ROM)PLUS: Rom Plus: NEGATIVE

## 2021-10-08 NOTE — Progress Notes (Signed)
Discharge instructions reviewed with patient and significant other.  Both deny any further questions or concerns at this time.

## 2021-10-08 NOTE — OB Triage Note (Signed)
Patient presented to L&D via EMS with complaints of leaking of fluid and contractions that started at 0700 while at work.  States she was in a lot of pain and someone at work told her her water broke. States her underwear were wet but hasn't noticed any further leaking of fluid.  States she's had vaginal spotting for a couple weeks and her MD has been following her for that as well as her GDM.  Denies decreased fetal movement

## 2021-10-08 NOTE — Final Progress Note (Signed)
L&D OB Triage Note  HPI:  Gwendolyn Rice is a 31 y.o. G1P0000 female at [redacted]w[redacted]d Estimated Date of Delivery: 10/28/21 who presented via EMS for complaints contractions beginning at 0700 while at work. She also noted possible LOF (reports slightly moistened underwear this morning).  She denies frank vaginal bleeding, but has noted vaginal spotting over the last few weeks. Denies decreased fetal movement.  Is also noting pain at her pubic region.   [facility]  Her pregnancy is complicated by mild obesity, gestational DM (on oral med), colitis, and atypical intermittent chest pain.      OB History  Gravida Para Term Preterm AB Living  1 0 0 0 0 0  SAB IAB Ectopic Multiple Live Births  0 0 0 0 0    # Outcome Date GA Lbr Len/2nd Weight Sex Delivery Anes PTL Lv  1 Current             Patient Active Problem List   Diagnosis Date Noted   Labor and delivery indication for care or intervention 10/08/2021   Class 1 obesity due to excess calories with serious comorbidity and body mass index (BMI) of 31.0 to 31.9 in adult 02/12/2021    Past Medical History:  Diagnosis Date   [place], inflammation around the chest   Chlamydia 12/2016, 05/2017   Colitis    Diarrhea    Gestational diabetes    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   Current Outpatient Medications on File Prior to Encounter  Medication Sig Dispense Refill   azaTHIOprine (IMURAN) 50 MG tablet Take 150 mg by mouth daily.     Cetirizine HCl (ZYRTEC PO) Take 1 tablet by mouth.     folic acid (FOLVITE) 1 MG tablet Take 1 mg by mouth daily.     omeprazole (PRILOSEC) 20 MG capsule TAKE 1 CAPSULE BY MOUTH EVERY DAY 90 capsule 1   Prenatal MV & Min w/FA-DHA (PRENATAL GUMMIES) 0.18-25 MG CHEW Chew 1 tablet by  mouth daily.     Probiotic Product (PROBIOTIC PO) Take 1 tablet by mouth.     promethazine-dextromethorphan (PROMETHAZINE-DM) 6.25-15 MG/5ML syrup Take 5 mLs by mouth 4 (four) times daily as needed for cough. 118 mL 0   valACYclovir (VALTREX) 500 MG tablet Take 1 tablet (500 mg total) by mouth 2 (two) times daily. For 3-5 days at sign of outbreak 30 tablet 1    Allergies  Allergen Reactions   Amoxicillin Swelling   Ciprofloxacin Swelling   Penicillins Other (See Comments)    Unknown reaction. as patient had a PCN reaction causing immediate rash, facial/tongue/throat swelling, SOB or lightheadedness with hypotension: unknown Has patient had a PCN reaction causing severe rash involving mucus membranes or skin necrosis: unknown Has patient had a PCN reaction that required hospitalization: no Has patient had a PCN reaction occurring within the last 10 years: yes If all of the above answers are "NO", then may proceed with Cephalosporin use.    Sulfa Antibiotics Swelling   Flagyl [Metronidazole] Nausea And Vomiting   Macrobid [Nitrofurantoin Monohyd Macro]     States she gets very sick     ROS:  Review of Systems - Negative except what is noted in HPI.   Physical Exam:  Blood pressure 130/79, pulse 82,  temperature 98.1 F (36.7 C), temperature source Oral, resp. rate 19, height 5' 2"  (1.575 m), weight 81.6 kg. General appearance: alert and no acute distress Abdomen: soft, non-tender; bowel sounds normal; no masses,  no organomegaly Pelvic: 0/thick/posterior/ballotable.  Difficult exam due to patient's discomfort.  Extremities: extremities normal, atraumatic, no cyanosis or edema   NST INTERPRETATION: Indications: rule out uterine contractions  Mode: External Baseline Rate (A): 140 bpm (fhr) Variability: Moderate Accelerations: 15 x 15 Decelerations: None     Contraction Frequency (min): 2-4  Impression: reactive   Labs:  Results for orders placed or performed during  the hospital encounter of 10/08/21  ROM Plus ([facility] only)  Result Value Ref Range   Rom Plus NEGATIVE   OB RESULTS CONSOLE GC/Chlamydia  Result Value Ref Range   Gonorrhea Negative    Chlamydia Negative   OB RESULTS CONSOLE HIV antibody  Result Value Ref Range   HIV Non-reactive   OB RESULTS CONSOLE Rubella Antibody  Result Value Ref Range   Rubella Nonimmune   OB RESULTS CONSOLE Hepatitis B surface antigen  Result Value Ref Range   Hepatitis B Surface Ag Negative   Urinalysis, Complete w Microscopic  Result Value Ref Range   Color, Urine AMBER (A) YELLOW   APPearance CLOUDY (A) CLEAR   Specific Gravity, Urine 1.029 1.005 - 1.030   pH 5.0 5.0 - 8.0   Glucose, UA NEGATIVE NEGATIVE mg/dL   Hgb urine dipstick NEGATIVE NEGATIVE   Bilirubin Urine NEGATIVE NEGATIVE   Ketones, ur 80 (A) NEGATIVE mg/dL   Protein, ur 100 (A) NEGATIVE mg/dL   Nitrite NEGATIVE NEGATIVE   Leukocytes,Ua LARGE (A) NEGATIVE   RBC / HPF 0-5 0 - 5 RBC/hpf   WBC, UA 21-50 0 - 5 WBC/hpf   Bacteria, UA MANY (A) NONE SEEN   Squamous Epithelial / LPF 21-50 0 - 5   Mucus PRESENT   OB RESULTS CONSOLE ABO/Rh  Result Value Ref Range   RH Type  Positive    ABO Grouping O   OB RESULTS CONSOLE Antibody Screen  Result Value Ref Range   Antibody Screen Negative     Assessment:  31 y.o. G1P0000 at 61w1dwith:  1.  Uterine contractions 2.  Pubic symphysis pain 3. GDM Class A2    Plan:  1. Uterine contractions - Patient ruled out for labor after 2.5 hrs of monitoring with no cervical change.  Discussed that she may have prodromal/early labor, discussed labor precautions.  2. Pubic symphysis pain - performed bedside sono, fetus appears to be asynclitic, more on maternal right. Pain also coincides with contractions. Discussed exercises to help to further align fetus with pelvis.  Offered pain management, patient notes she will take Tylenol when she goes home. Patient notes that it is difficult for her to work due  ongoing pain, desires to be taken out of work until her induction date. I [place] can give work notice for 2-3 days for today's visit.  UA today with leukocytes, ketones and protein. Encouraged adequate hydration at home. Will also send urine for culture.  3. GDM Class A2, patient notes she will have an induction, scheduled for March 6th. Notes good control on medications. Has appt with OB scheduled for tomorrow.    CRubie Maid MD [facility]

## 2021-10-08 NOTE — Progress Notes (Signed)
Dr.Cherry at bedside.  Cervix reassessed and it remains closed and posterior. MD Discussed negative ROM plus test and plans to discharge patient home.  Signs of labor discussed and when to return to the hospital.  Patient expressing concerns about continuing to work due to pubic bone pain.  Dr.Cherry to write an excuse note for the next couples days but encouraged patient to follow up with her OB in [place] for long term plan.

## 2021-10-09 ENCOUNTER — Telehealth (HOSPITAL_COMMUNITY): Payer: Self-pay | Admitting: *Deleted

## 2021-10-09 ENCOUNTER — Encounter (HOSPITAL_COMMUNITY): Payer: Self-pay | Admitting: *Deleted

## 2021-10-09 DIAGNOSIS — O24414 Gestational diabetes mellitus in pregnancy, insulin controlled: Secondary | ICD-10-CM | POA: Diagnosis not present

## 2021-10-09 DIAGNOSIS — Z3A37 37 weeks gestation of pregnancy: Secondary | ICD-10-CM | POA: Diagnosis not present

## 2021-10-09 NOTE — Telephone Encounter (Signed)
Preadmission screen  

## 2021-10-11 DIAGNOSIS — Z3A37 37 weeks gestation of pregnancy: Secondary | ICD-10-CM | POA: Diagnosis not present

## 2021-10-11 DIAGNOSIS — O24415 Gestational diabetes mellitus in pregnancy, controlled by oral hypoglycemic drugs: Secondary | ICD-10-CM | POA: Diagnosis not present

## 2021-10-17 DIAGNOSIS — O24415 Gestational diabetes mellitus in pregnancy, controlled by oral hypoglycemic drugs: Secondary | ICD-10-CM | POA: Diagnosis not present

## 2021-10-17 DIAGNOSIS — Z3A38 38 weeks gestation of pregnancy: Secondary | ICD-10-CM | POA: Diagnosis not present

## 2021-10-23 ENCOUNTER — Other Ambulatory Visit: Payer: Self-pay

## 2021-10-23 ENCOUNTER — Inpatient Hospital Stay (HOSPITAL_COMMUNITY): Payer: BC Managed Care – PPO | Admitting: Anesthesiology

## 2021-10-23 ENCOUNTER — Inpatient Hospital Stay (HOSPITAL_COMMUNITY): Payer: BC Managed Care – PPO

## 2021-10-23 ENCOUNTER — Encounter (HOSPITAL_COMMUNITY): Payer: Self-pay | Admitting: Obstetrics and Gynecology

## 2021-10-23 ENCOUNTER — Inpatient Hospital Stay (HOSPITAL_COMMUNITY)
Admission: AD | Admit: 2021-10-23 | Discharge: 2021-10-26 | DRG: 807 | Disposition: A | Discharge status: Home / Self Care | Payer: BC Managed Care – PPO | Attending: Obstetrics and Gynecology | Admitting: Obstetrics and Gynecology

## 2021-10-23 DIAGNOSIS — O134 Gestational [pregnancy-induced] hypertension without significant proteinuria, complicating childbirth: Secondary | ICD-10-CM | POA: Diagnosis present

## 2021-10-23 DIAGNOSIS — Z3490 Encounter for supervision of normal pregnancy, unspecified, unspecified trimester: Secondary | ICD-10-CM

## 2021-10-23 DIAGNOSIS — O133 Gestational [pregnancy-induced] hypertension without significant proteinuria, third trimester: Secondary | ICD-10-CM | POA: Diagnosis not present

## 2021-10-23 DIAGNOSIS — O24425 Gestational diabetes mellitus in childbirth, controlled by oral hypoglycemic drugs: Secondary | ICD-10-CM | POA: Diagnosis not present

## 2021-10-23 DIAGNOSIS — Z23 Encounter for immunization: Secondary | ICD-10-CM | POA: Diagnosis not present

## 2021-10-23 DIAGNOSIS — O24429 Gestational diabetes mellitus in childbirth, unspecified control: Secondary | ICD-10-CM | POA: Diagnosis not present

## 2021-10-23 DIAGNOSIS — Z20822 Contact with and (suspected) exposure to covid-19: Secondary | ICD-10-CM | POA: Diagnosis present

## 2021-10-23 DIAGNOSIS — Z3A39 39 weeks gestation of pregnancy: Secondary | ICD-10-CM | POA: Diagnosis not present

## 2021-10-23 DIAGNOSIS — O24415 Gestational diabetes mellitus in pregnancy, controlled by oral hypoglycemic drugs: Secondary | ICD-10-CM | POA: Diagnosis not present

## 2021-10-23 LAB — TYPE AND SCREEN
ABO/RH(D): O POS
Antibody Screen: NEGATIVE

## 2021-10-23 LAB — GLUCOSE, CAPILLARY
Glucose-Capillary: 101 mg/dL — ABNORMAL HIGH (ref 70–99)
Glucose-Capillary: 127 mg/dL — ABNORMAL HIGH (ref 70–99)
Glucose-Capillary: 64 mg/dL — ABNORMAL LOW (ref 70–99)
Glucose-Capillary: 87 mg/dL (ref 70–99)
Glucose-Capillary: 88 mg/dL (ref 70–99)
Glucose-Capillary: 96 mg/dL (ref 70–99)

## 2021-10-23 LAB — CBC
HCT: 35.3 % — ABNORMAL LOW (ref 36.0–46.0)
Hemoglobin: 11.9 g/dL — ABNORMAL LOW (ref 12.0–15.0)
MCH: 32.3 pg (ref 26.0–34.0)
MCHC: 33.7 g/dL (ref 30.0–36.0)
MCV: 95.9 fL (ref 80.0–100.0)
Platelets: 256 10*3/uL (ref 150–400)
RBC: 3.68 MIL/uL — ABNORMAL LOW (ref 3.87–5.11)
RDW: 14.9 % (ref 11.5–15.5)
WBC: 11.4 10*3/uL — ABNORMAL HIGH (ref 4.0–10.5)
nRBC: 0 % (ref 0.0–0.2)

## 2021-10-23 LAB — RPR: RPR Ser Ql: NONREACTIVE

## 2021-10-23 LAB — RESP PANEL BY RT-PCR (FLU A&B, COVID) ARPGX2
Influenza A by PCR: NEGATIVE
Influenza B by PCR: NEGATIVE
SARS Coronavirus 2 by RT PCR: NEGATIVE

## 2021-10-23 MED ORDER — DIPHENHYDRAMINE HCL 50 MG/ML IJ SOLN: 12.5000 mg | INTRAMUSCULAR | Status: DC | PRN

## 2021-10-23 MED ORDER — WITCH HAZEL-GLYCERIN EX PADS
MEDICATED_PAD | CUTANEOUS | Status: DC | PRN
  Filled 2021-10-23: qty 100

## 2021-10-23 MED ORDER — OXYTOCIN-SODIUM CHLORIDE 30-0.9 UT/500ML-% IV SOLN
1.0000 m[IU]/min | INTRAVENOUS | Status: DC
  Administered 2021-10-23: 2 m[IU]/min via INTRAVENOUS
  Filled 2021-10-23: qty 500

## 2021-10-23 MED ORDER — EPHEDRINE 5 MG/ML INJ: 10.0000 mg | INTRAVENOUS | Status: DC | PRN

## 2021-10-23 MED ORDER — PANTOPRAZOLE SODIUM 40 MG PO TBEC
40.0000 mg | DELAYED_RELEASE_TABLET | Freq: Every day | ORAL | Status: DC
  Administered 2021-10-23: 40 mg via ORAL
  Filled 2021-10-23: qty 1

## 2021-10-23 MED ORDER — ACETAMINOPHEN 325 MG PO TABS
650.0000 mg | ORAL_TABLET | ORAL | Status: DC | PRN
  Administered 2021-10-23 (×2): 650 mg via ORAL
  Filled 2021-10-23 (×2): qty 2

## 2021-10-23 MED ORDER — OXYCODONE-ACETAMINOPHEN 5-325 MG PO TABS: 1.0000 | ORAL_TABLET | ORAL | Status: DC | PRN

## 2021-10-23 MED ORDER — RISAQUAD PO CAPS
1.0000 | ORAL_CAPSULE | Freq: Every day | ORAL | Status: DC
  Administered 2021-10-23: 1 via ORAL
  Filled 2021-10-23 (×2): qty 1

## 2021-10-23 MED ORDER — FOLIC ACID 1 MG PO TABS
1.0000 mg | ORAL_TABLET | Freq: Every day | ORAL | Status: DC
  Administered 2021-10-23 – 2021-10-26 (×4): 1 mg via ORAL
  Filled 2021-10-23 (×5): qty 1

## 2021-10-23 MED ORDER — ONDANSETRON HCL 4 MG/2ML IJ SOLN
4.0000 mg | Freq: Four times a day (QID) | INTRAMUSCULAR | Status: DC | PRN
  Administered 2021-10-23: 4 mg via INTRAVENOUS
  Filled 2021-10-23: qty 2

## 2021-10-23 MED ORDER — FENTANYL-BUPIVACAINE-NACL 0.5-0.125-0.9 MG/250ML-% EP SOLN
12.0000 mL/h | EPIDURAL | Status: DC | PRN
  Administered 2021-10-23: 12 mL/h via EPIDURAL
  Filled 2021-10-23: qty 250

## 2021-10-23 MED ORDER — LIDOCAINE HCL (PF) 1 % IJ SOLN
30.0000 mL | INTRAMUSCULAR | Status: DC | PRN
  Filled 2021-10-23: qty 30

## 2021-10-23 MED ORDER — MISOPROSTOL 25 MCG QUARTER TABLET
25.0000 ug | ORAL_TABLET | ORAL | Status: DC | PRN
  Administered 2021-10-23: 25 ug via VAGINAL
  Filled 2021-10-23 (×2): qty 1

## 2021-10-23 MED ORDER — LACTATED RINGERS IV SOLN: 500.0000 mL | Freq: Once | INTRAVENOUS | Status: DC

## 2021-10-23 MED ORDER — TERBUTALINE SULFATE 1 MG/ML IJ SOLN: 0.2500 mg | Freq: Once | INTRAMUSCULAR | Status: DC | PRN

## 2021-10-23 MED ORDER — MISOPROSTOL 50MCG HALF TABLET
50.0000 ug | ORAL_TABLET | ORAL | Status: DC | PRN
  Administered 2021-10-23 (×3): 50 ug via ORAL
  Filled 2021-10-23 (×3): qty 1

## 2021-10-23 MED ORDER — AZATHIOPRINE 50 MG PO TABS
150.0000 mg | ORAL_TABLET | Freq: Every day | ORAL | Status: DC
  Administered 2021-10-23: 100 mg via ORAL
  Filled 2021-10-23 (×2): qty 3

## 2021-10-23 MED ORDER — LIDOCAINE HCL (PF) 1 % IJ SOLN
INTRAMUSCULAR | Status: DC | PRN
  Administered 2021-10-23: 10 mL via EPIDURAL

## 2021-10-23 MED ORDER — FENTANYL CITRATE (PF) 100 MCG/2ML IJ SOLN
50.0000 ug | INTRAMUSCULAR | Status: DC | PRN
  Administered 2021-10-23 (×2): 50 ug via INTRAVENOUS
  Filled 2021-10-23 (×2): qty 2

## 2021-10-23 MED ORDER — PHENYLEPHRINE 40 MCG/ML (10ML) SYRINGE FOR IV PUSH (FOR BLOOD PRESSURE SUPPORT): 80.0000 ug | PREFILLED_SYRINGE | INTRAVENOUS | Status: DC | PRN

## 2021-10-23 MED ORDER — LACTATED RINGERS IV SOLN: INTRAVENOUS | Status: DC

## 2021-10-23 MED ORDER — OXYTOCIN-SODIUM CHLORIDE 30-0.9 UT/500ML-% IV SOLN
2.5000 [IU]/h | INTRAVENOUS | Status: DC
  Administered 2021-10-24: 2.5 [IU]/h via INTRAVENOUS

## 2021-10-23 MED ORDER — OXYCODONE-ACETAMINOPHEN 5-325 MG PO TABS: 2.0000 | ORAL_TABLET | ORAL | Status: DC | PRN

## 2021-10-23 MED ORDER — LACTATED RINGERS IV SOLN: 500.0000 mL | INTRAVENOUS | Status: DC | PRN

## 2021-10-23 MED ORDER — SOD CITRATE-CITRIC ACID 500-334 MG/5ML PO SOLN: 30.0000 mL | ORAL | Status: DC | PRN

## 2021-10-23 MED ORDER — INSULIN ASPART 100 UNIT/ML IJ SOLN
0.0000 [IU] | INTRAMUSCULAR | Status: DC
  Administered 2021-10-23: 1 [IU] via SUBCUTANEOUS

## 2021-10-23 MED ORDER — OXYTOCIN BOLUS FROM INFUSION
333.0000 mL | Freq: Once | INTRAVENOUS | Status: AC
Start: 2021-10-23 — End: 2021-10-24
  Administered 2021-10-24: 333 mL via INTRAVENOUS

## 2021-10-23 NOTE — H&P (Signed)
31 y.o. [redacted]w[redacted]d G1P0000 comes in c/o scheduled IOL for GDMA2.  Otherwise has good fetal movement and no bleeding. ? ?Past Medical History:  ?Diagnosis Date  ? [place]   ? since [place], inflammation around the chest  ? Chlamydia 12/2016, 05/2017  ? Colitis   ? Diarrhea   ? Gestational diabetes   ? 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 LEEP 07/2013  ? History of kidney stones   ? Vaginal Pap smear, abnormal   ?  ?Past Surgical History:  ?Procedure Laterality Date  ? LEEP N/A 07/23/2013  ? Procedure: LOOP ELECTROSURGICAL EXCISION PROCEDURE (LEEP);  pathology HGSIL involving endocervical glands. Ectocervical and endocervical margins positive for LGSIL  ?  ?OB History  ?Gravida Para Term Preterm AB Living  ?1 0 0 0 0 0  ?SAB IAB Ectopic Multiple Live Births  ?0 0 0 0 0  ?  ?# Outcome Date GA Lbr Len/2nd Weight Sex Delivery Anes PTL Lv  ?1 Current           ?  ?Social History  ? ?Socioeconomic History  ? Marital status: Significant Other  ?  Spouse name: QPatsy [place] ? Number of children: 0  ? Years of education: Not on file  ? Highest education level: Not on file  ?Occupational History  ? Occupation: assembly line  ?  Comment: gNurse, adult ?Tobacco Use  ? Smoking status: Never  ? Smokeless tobacco: Never  ?Vaping Use  ? Vaping Use: Never used  ?Substance and Sexual Activity  ? Alcohol use: Yes  ?  Alcohol/week: 0.0 standard drinks  ?  Comment: Rare  ? Drug use: Never  ? Sexual activity: Yes  ?  Partners: Male  ?  Birth control/protection: None  ?Other Topics Concern  ? Not on file  ?Social History Narrative  ? Lives with significant other  ? ?Social Determinants of Health  ? ?Financial Resource Strain: Not on file  ?Food Insecurity: Not on file  ?Transportation Needs: Not on file  ?Physical Activity: Not on file  ?Stress: Not on file  ?Social Connections: Not on file  ?Intimate Partner Violence: Not on file  ? Amoxicillin, Ciprofloxacin, Penicillins, Sulfa antibiotics,  Flagyl [metronidazole], and Macrobid [nitrofurantoin monohyd macro]  ? ? ?Prenatal Transfer Tool  ?Maternal Diabetes: Yes:  Diabetes Type:  Insulin/Medication controlled ?Genetic Screening: Normal ?Maternal Ultrasounds/Referrals: Normal ?Fetal Ultrasounds or other Referrals:  None ?Maternal Substance Abuse:  No ?Significant Maternal Medications:  Meds include: Other: metformin 1500 qhs ?Significant Maternal Lab Results: Group B Strep negative ? ?Other PNC: GDMA2 non compliant with diabetes education classes, obese, last growth scan 35.5: 5#8, (29%) ? ? ? ?Vitals:  ? 10/23/21 0106 10/23/21 0107 10/23/21 0111 10/23/21 0835  ?BP:  130/89  130/84  ?Pulse:  77  91  ?Resp:  18  18  ?Temp:    98.2 ?F (36.8 ?C)  ?TempSrc:    Axillary  ?SpO2: 96%  96%   ?Weight:      ?Height:      ? ? ?Lungs/Cor:  NAD ?Abdomen:  soft, gravid ?Ex:  no cords, erythema ?SVE:  1/70/-1 ?FHTs:  140, good STV, NST R; Cat 1 tracing. ?Toco:  q 2-6 ? ? ?A/P   Admitted for IOL for GGalisteo? BS q4hr in latent labor, fasting this morning 64, will start Insulin protocol as needed ? GBS Neg ?Cytotec for cervical ripening, anticipate AROM, pitocin ? ?SAllyn Kenner ?

## 2021-10-23 NOTE — Anesthesia Procedure Notes (Signed)
Epidural ?Patient location during procedure: OB ?Start time: 10/23/2021 9:53 PM ?End time: 10/23/2021 10:07 PM ? ?Staffing ?Anesthesiologist: Lidia Collum, MD ?Performed: anesthesiologist  ? ?Preanesthetic Checklist ?Completed: patient identified, IV checked, risks and benefits discussed, monitors and equipment checked, pre-op evaluation and timeout performed ? ?Epidural ?Patient position: sitting ?Prep: DuraPrep ?Patient monitoring: heart rate, continuous pulse ox and blood pressure ?Approach: midline ?Location: L3-L4 ?Injection technique: LOR air ? ?Needle:  ?Needle type: Tuohy  ?Needle gauge: 17 G ?Needle length: 9 cm ?Needle insertion depth: 6 cm ?Catheter type: closed end flexible ?Catheter size: 19 Gauge ?Catheter at skin depth: 11 cm ?Test dose: negative ? ?Assessment ?Events: blood not aspirated, injection not painful, no injection resistance, no paresthesia and negative IV test ? ?Additional Notes ?Reason for block:procedure for pain ? ? ? ?

## 2021-10-23 NOTE — Progress Notes (Signed)
Pt feeling more contractions after buccal cytotec.  No complaints but desires a meal. ?FHT: 130 mod var +accels no decels ?TOCO: q1-5 ?SVE: 1/70/-1 ?A/P ?Comfortable ?Will allow one light labor meal, then continue cytotec ?Other routine care ?

## 2021-10-23 NOTE — Anesthesia Preprocedure Evaluation (Addendum)
Anesthesia Evaluation  ?Patient identified by MRN, date of birth, ID band ?Patient awake ? ? ? ?Reviewed: ?Allergy & Precautions, H&P , NPO status , Patient's Chart, lab work & pertinent test results ? ?History of Anesthesia Complications ?Negative for: history of anesthetic complications ? ?Airway ?Mallampati: II ? ?TM Distance: >3 FB ? ? ? ? Dental ?  ?Pulmonary ?neg pulmonary ROS,  ?  ?Pulmonary exam normal ? ? ? ? ? ? ? Cardiovascular ?negative cardio ROS ? ? ?Rhythm:regular Rate:Normal ? ?Anginal pain listed in history- pt states she was diagnosed with costochondritis and has no h/o cardiac disease ?  ?Neuro/Psych ?negative neurological ROS ? negative psych ROS  ? GI/Hepatic ?negative GI ROS, Neg liver ROS,   ?Endo/Other  ?diabetes, Gestational ? Renal/GU ?negative Renal ROS  ?negative genitourinary ?  ?Musculoskeletal ? ? Abdominal ?  ?Peds ? Hematology ?negative hematology ROS ?(+)   ?Anesthesia Other Findings ? ? Reproductive/Obstetrics ?(+) Pregnancy ? ?  ? ? ? ? ? ? ? ? ? ? ? ? ? ?  ?  ? ? ? ? ? ? ? ?Anesthesia Physical ?Anesthesia Plan ? ?ASA: 2 ? ?Anesthesia Plan: Epidural  ? ?Post-op Pain Management:   ? ?Induction:  ? ?PONV Risk Score and Plan:  ? ?Airway Management Planned:  ? ?Additional Equipment:  ? ?Intra-op Plan:  ? ?Post-operative Plan:  ? ?Informed Consent: I have reviewed the patients History and Physical, chart, labs and discussed the procedure including the risks, benefits and alternatives for the proposed anesthesia with the patient or authorized representative who has indicated his/her understanding and acceptance.  ? ? ? ? ? ?Plan Discussed with:  ? ?Anesthesia Plan Comments:   ? ? ? ? ? ? ?Anesthesia Quick Evaluation ? ?

## 2021-10-23 NOTE — Progress Notes (Signed)
Hypoglycemic Event ? ?CBG: 64 ? ?Treatment: 4 oz juice/soda ? ?Symptoms: Shaky and Hungry ? ?Follow-up CBG: [id number] CBG Result:127 ? ?Possible Reasons for Event: Inadequate meal intake ? ?Comments/MD notified:yes  ? ? ? ?Saleem Coccia, Narda Amber ? ? ?

## 2021-10-23 NOTE — Progress Notes (Signed)
Patient comfortable with epidural.  Reports continued LOF.  No complaints. ?FHT:125 mod var +accels no decels ?TOCO: q2-6 ?SVE: 3/100/0 ?A/P:  ?SROM, pit 2x2 ?Cont. Other routine care. ?

## 2021-10-24 LAB — GLUCOSE, CAPILLARY
Glucose-Capillary: 89 mg/dL (ref 70–99)
Glucose-Capillary: 92 mg/dL (ref 70–99)

## 2021-10-24 MED ORDER — ONDANSETRON HCL 4 MG PO TABS: 4.0000 mg | ORAL_TABLET | ORAL | Status: DC | PRN

## 2021-10-24 MED ORDER — SIMETHICONE 80 MG PO CHEW: 80.0000 mg | CHEWABLE_TABLET | ORAL | Status: DC | PRN

## 2021-10-24 MED ORDER — PRENATAL MULTIVITAMIN CH
1.0000 | ORAL_TABLET | Freq: Every day | ORAL | Status: DC
  Administered 2021-10-25: 16:00:00 1 via ORAL
  Filled 2021-10-24 (×2): qty 1

## 2021-10-24 MED ORDER — ZOLPIDEM TARTRATE 5 MG PO TABS: 5.0000 mg | ORAL_TABLET | Freq: Every evening | ORAL | Status: DC | PRN

## 2021-10-24 MED ORDER — SENNOSIDES-DOCUSATE SODIUM 8.6-50 MG PO TABS
2.0000 | ORAL_TABLET | Freq: Every day | ORAL | Status: DC
  Administered 2021-10-25: 11:00:00 2 via ORAL
  Filled 2021-10-24: qty 2

## 2021-10-24 MED ORDER — ONDANSETRON HCL 4 MG/2ML IJ SOLN: 4.0000 mg | INTRAMUSCULAR | Status: DC | PRN

## 2021-10-24 MED ORDER — FLUCONAZOLE 150 MG PO TABS
150.0000 mg | ORAL_TABLET | Freq: Once | ORAL | Status: AC
  Administered 2021-10-24: 150 mg via ORAL
  Filled 2021-10-24: qty 1

## 2021-10-24 MED ORDER — COCONUT OIL OIL: 1.0000 "application " | TOPICAL_OIL | Status: DC | PRN

## 2021-10-24 MED ORDER — BENZOCAINE-MENTHOL 20-0.5 % EX AERO
1.0000 "application " | INHALATION_SPRAY | CUTANEOUS | Status: DC | PRN
  Filled 2021-10-24: qty 56

## 2021-10-24 MED ORDER — TETANUS-DIPHTH-ACELL PERTUSSIS 5-2.5-18.5 LF-MCG/0.5 IM SUSY: 0.5000 mL | PREFILLED_SYRINGE | Freq: Once | INTRAMUSCULAR | Status: DC

## 2021-10-24 MED ORDER — DIPHENHYDRAMINE HCL 25 MG PO CAPS: 25.0000 mg | ORAL_CAPSULE | Freq: Four times a day (QID) | ORAL | Status: DC | PRN

## 2021-10-24 MED ORDER — OXYCODONE HCL 5 MG PO TABS: 10.0000 mg | ORAL_TABLET | ORAL | Status: DC | PRN

## 2021-10-24 MED ORDER — WITCH HAZEL-GLYCERIN EX PADS: 1.0000 "application " | MEDICATED_PAD | CUTANEOUS | Status: DC | PRN

## 2021-10-24 MED ORDER — ACETAMINOPHEN 325 MG PO TABS
650.0000 mg | ORAL_TABLET | ORAL | Status: DC | PRN
  Administered 2021-10-24 – 2021-10-26 (×9): 650 mg via ORAL
  Filled 2021-10-24 (×9): qty 2

## 2021-10-24 MED ORDER — IBUPROFEN 600 MG PO TABS
600.0000 mg | ORAL_TABLET | Freq: Four times a day (QID) | ORAL | Status: DC
  Filled 2021-10-24 (×2): qty 1

## 2021-10-24 MED ORDER — AZATHIOPRINE 50 MG PO TABS
100.0000 mg | ORAL_TABLET | Freq: Every day | ORAL | Status: DC
Start: 2021-10-24 — End: 2021-10-26
  Administered 2021-10-24 – 2021-10-26 (×3): 100 mg via ORAL
  Filled 2021-10-24 (×3): qty 2

## 2021-10-24 MED ORDER — TRANEXAMIC ACID-NACL 1000-0.7 MG/100ML-% IV SOLN
INTRAVENOUS | Status: AC
  Filled 2021-10-24: qty 100

## 2021-10-24 MED ORDER — TRANEXAMIC ACID-NACL 1000-0.7 MG/100ML-% IV SOLN
1000.0000 mg | INTRAVENOUS | Status: AC
  Administered 2021-10-24: 1000 mg via INTRAVENOUS

## 2021-10-24 MED ORDER — OXYCODONE HCL 5 MG PO TABS: 5.0000 mg | ORAL_TABLET | ORAL | Status: DC | PRN

## 2021-10-24 MED ORDER — DIBUCAINE (PERIANAL) 1 % EX OINT: 1.0000 "application " | TOPICAL_OINTMENT | CUTANEOUS | Status: DC | PRN

## 2021-10-24 NOTE — Lactation Note (Addendum)
This note was copied from a baby's chart. ?Lactation Consultation Note ? ?Patient Name: Girl Alilah Weathington ?Today's Date: 10/24/2021 ?  ?Age:31 hours ? ?LC went in to see Mom to assist with breastfeeding. Infant had a recent feeding of 10 ml of formula.  ? LC went over different latch positions, feeding cues and urine/ stool output.  ?Mom still undecided if she will attempt a latch or just exclusively bottle feed formula.  ? ?Mom to call for latch assistance with next feeding.  ? ?Plan 1. To feed based on cues 8-12x 24hr period. Mom to offer breasts and look for signs of milk transfer.  ?2. Mom to supplement with formula 5-7 ml per feeding if latching up to 15 ml per feeding if infant not going to the breast. Mom to increase as tolerated.  ?3. I and O sheet reviewed.  ?All questions answered at the end of the visit.  ?Maternal Data ?  ? ?Feeding ?  ? ?LATCH Score ?  ? ?  ? ?  ? ?  ? ?  ? ?  ? ? ?Lactation Tools Discussed/Used ?  ? ?Interventions ?  ? ?Discharge ?  ? ?Consult Status ?  ? ? ? ?Zurri Rudden  Nicholson-Springer ?10/24/2021, 4:53 PM ? ? ? ?

## 2021-10-24 NOTE — Lactation Note (Addendum)
This note was copied from a baby's chart. ?Lactation Consultation Note ? ?Patient Name: Gwendolyn Rice ?Today's Date: 10/24/2021 ?Reason for consult: L&D Initial assessment;Primapara;1st time breastfeeding;Term  GDM ?Age:31 hours ? ?LC to L&D to assist with first feeding at the breast.  P1 Mom holding baby STS on her chest.  Baby alert and cueing.  Mom declined lactation assistance as she wasn't feeling well.   ? ?Briefly informed Mom of the "golden hour" of baby's life and how baby will become sleepy in the next couple hours.  Mom declined. ? ?Encouraged Mom to ask for help. ? ?Maternal Data ?Has patient been taught Hand Expression?: No ?Does the patient have breastfeeding experience prior to this delivery?: No ? ?Feeding ?Mother's Current Feeding Choice: Breast Milk and Formula ? ?Interventions ?Interventions: Skin to skin ?Consult Status ?Consult Status: Follow-up from L&D ?Date: 10/24/21 ?Follow-up type: In-patient ? ? ? ?Tilda Burrow E ?10/24/2021, 7:54 AM ? ? ? ?

## 2021-10-25 LAB — COMPREHENSIVE METABOLIC PANEL
ALT: 19 U/L (ref 0–44)
AST: 31 U/L (ref 15–41)
Albumin: 2.4 g/dL — ABNORMAL LOW (ref 3.5–5.0)
Alkaline Phosphatase: 120 U/L (ref 38–126)
Anion gap: 10 (ref 5–15)
BUN: 10 mg/dL (ref 6–20)
CO2: 24 mmol/L (ref 22–32)
Calcium: 8.9 mg/dL (ref 8.9–10.3)
Chloride: 105 mmol/L (ref 98–111)
Creatinine, Ser: 0.78 mg/dL (ref 0.44–1.00)
GFR, Estimated: >60 mL/min (ref >60)
Glucose, Bld: 79 mg/dL (ref 70–99)
Potassium: 3.7 mmol/L (ref 3.5–5.1)
Sodium: 139 mmol/L (ref 135–145)
Total Bilirubin: 0.5 mg/dL (ref 0.3–1.2)
Total Protein: 5.5 g/dL — ABNORMAL LOW (ref 6.5–8.1)

## 2021-10-25 LAB — CBC
HCT: 30.9 % — ABNORMAL LOW (ref 36.0–46.0)
Hemoglobin: 10.5 g/dL — ABNORMAL LOW (ref 12.0–15.0)
MCH: 32.7 pg (ref 26.0–34.0)
MCHC: 34 g/dL (ref 30.0–36.0)
MCV: 96.3 fL (ref 80.0–100.0)
Platelets: 239 10*3/uL (ref 150–400)
RBC: 3.21 MIL/uL — ABNORMAL LOW (ref 3.87–5.11)
RDW: 15.5 % (ref 11.5–15.5)
WBC: 13.1 10*3/uL — ABNORMAL HIGH (ref 4.0–10.5)
nRBC: 0 % (ref 0.0–0.2)

## 2021-10-25 MED ORDER — NIFEDIPINE ER OSMOTIC RELEASE 30 MG PO TB24
30.0000 mg | ORAL_TABLET | Freq: Once | ORAL | Status: AC
  Administered 2021-10-25: 30 mg via ORAL
  Filled 2021-10-25: qty 1

## 2021-10-25 MED ORDER — NIFEDIPINE ER OSMOTIC RELEASE 30 MG PO TB24
30.0000 mg | ORAL_TABLET | Freq: Once | ORAL | Status: AC
  Administered 2021-10-25: 07:00:00 30 mg via ORAL
  Filled 2021-10-25: qty 1

## 2021-10-25 MED ORDER — NIFEDIPINE ER OSMOTIC RELEASE 30 MG PO TB24
60.0000 mg | ORAL_TABLET | Freq: Every day | ORAL | Status: DC
  Administered 2021-10-26: 60 mg via ORAL
  Filled 2021-10-25: qty 2

## 2021-10-25 NOTE — Progress Notes (Signed)
Called Dr. Philis Pique and reported elevated BP. Patient denies blurred vision, headache or epigastric pain. Pt emotional, tearful and states her colitis is "kicking in". Dr Philis Pique wants BP to be checked at 10 pm and notified if BP is 140/90 or greater. If BP is below, may resume routine VS and call if elevated BP or pt symptomatic. ?

## 2021-10-25 NOTE — Progress Notes (Signed)
Patient was given handout and explanation for MMR and patient desires vaccine before discharge.  ?

## 2021-10-25 NOTE — Lactation Note (Signed)
This note was copied from a baby's chart. ?Lactation Consultation Note ? ?Patient Name: Girl Sherrilyn Gambone ?Today's Date: 10/25/2021 ?Reason for consult: Follow-up assessment;Primapara;1st time breastfeeding;Term;Other (Comment) (per mom feel like I;m going to just formula feed. LC reviewed how to dry milk up. mom has the BF resource sheet if she changes her mind.) ?Age:31 hours ? ?Maternal Data ?  ? ?Feeding ?Mother's Current Feeding Choice: Breast Milk and Formula ?Nipple Type: Slow - flow ? ?LATCH Score ?  ? ?  ? ?  ? ?  ? ?  ? ?  ? ? ?Lactation Tools Discussed/Used ?  ? ?Interventions ?  ? ?Discharge ?  ? ?Consult Status ?Consult Status: Complete ?Date: 10/25/21 ? ? ? ?Jerlyn Ly Clete Kuch ?10/25/2021, 1:00 PM ? ? ? ?

## 2021-10-25 NOTE — Progress Notes (Signed)
Vitals:  ? 10/24/21 1417 10/24/21 2100 10/25/21 0523 10/25/21 6160  ?BP: 121/90 (!) 129/96 (!) 139/101 (!) 133/96  ?Pulse: 86 89 78 93  ?Resp:  12 14   ?Temp: 98.4 ?F (36.9 ?C) 98.3 ?F (36.8 ?C) 98 ?F (36.7 ?C)   ?TempSrc: Oral Oral Oral   ?SpO2: 99% 97% 99%   ?Weight:      ?Height:      ?  ?Results for orders placed or performed during the hospital encounter of 10/23/21 (from the past 24 hour(s))  ?CBC     Status: Abnormal  ? Collection Time: 10/25/21  5:54 AM  ?Result Value Ref Range  ? WBC 13.1 (H) 4.0 - 10.5 K/uL  ? RBC 3.21 (L) 3.87 - 5.11 MIL/uL  ? Hemoglobin 10.5 (L) 12.0 - 15.0 g/dL  ? HCT 30.9 (L) 36.0 - 46.0 %  ? MCV 96.3 80.0 - 100.0 fL  ? MCH 32.7 26.0 - 34.0 pg  ? MCHC 34.0 30.0 - 36.0 g/dL  ? RDW 15.5 11.5 - 15.5 %  ? Platelets 239 150 - 400 K/uL  ? nRBC 0.0 0.0 - 0.2 %  ?Comprehensive metabolic panel     Status: Abnormal  ? Collection Time: 10/25/21  6:54 AM  ?Result Value Ref Range  ? Sodium 139 135 - 145 mmol/L  ? Potassium 3.7 3.5 - 5.1 mmol/L  ? Chloride 105 98 - 111 mmol/L  ? CO2 24 22 - 32 mmol/L  ? Glucose, Bld 79 70 - 99 mg/dL  ? BUN 10 6 - 20 mg/dL  ? Creatinine, Ser 0.78 0.44 - 1.00 mg/dL  ? Calcium 8.9 8.9 - 10.3 mg/dL  ? Total Protein 5.5 (L) 6.5 - 8.1 g/dL  ? Albumin 2.4 (L) 3.5 - 5.0 g/dL  ? AST 31 15 - 41 U/L  ? ALT 19 0 - 44 U/L  ? Alkaline Phosphatase 120 38 - 126 U/L  ? Total Bilirubin 0.5 0.3 - 1.2 mg/dL  ? GFR, Estimated >60 >60 mL/min  ? Anion gap 10 5 - 15  ?  ? ?Need to follow up on BPs after meds given.  Will probably need to stay until tomorrow. ?

## 2021-10-25 NOTE — Anesthesia Postprocedure Evaluation (Signed)
Anesthesia Post Note ? ?Patient: Gwendolyn Rice ? ?Procedure(s) Performed: AN AD HOC LABOR EPIDURAL ? ?  ? ?Patient location during evaluation: Mother Baby ?Anesthesia Type: Epidural ?Level of consciousness: awake, oriented and awake and alert ?Pain management: pain level controlled ?Vital Signs Assessment: post-procedure vital signs reviewed and stable ?Respiratory status: spontaneous breathing, respiratory function stable and nonlabored ventilation ?Cardiovascular status: stable ?Postop Assessment: no headache, adequate PO intake, able to ambulate, patient able to bend at knees and no apparent nausea or vomiting ?Anesthetic complications: no ? ? ?No notable events documented. ? ?Last Vitals:  ?Vitals:  ? 10/25/21 0523 10/25/21 0627  ?BP: (!) 139/101 (!) 133/96  ?Pulse: 78 93  ?Resp: 14   ?Temp: 36.7 ?C   ?SpO2: 99%   ?  ?Last Pain:  ?Vitals:  ? 10/25/21 0523  ?TempSrc: Oral  ?PainSc: 4   ? ?Pain Goal:   ? ?  ?  ?  ?  ?  ?  ?  ? ?[name] ? ? ? ? ?

## 2021-10-25 NOTE — Progress Notes (Signed)
Patient ID: Gwendolyn Rice, female   DOB: Jul 27, 1991, 30 y.o.   MRN: [id number] ?RN calls with elevated BP readings noted overnight.  Has had intermittent elevated BP since admission. ? ?Ordered CBC and CMP this AM and will started procardia XL 78m ?

## 2021-10-25 NOTE — Progress Notes (Addendum)
Patient is eating, ambulating, voiding.  Pain control is good. ? ?Vitals:  ? 10/24/21 1417 10/24/21 2100 10/25/21 0523 10/25/21 3220  ?BP: 121/90 (!) 129/96 (!) 139/101 (!) 133/96  ?Pulse: 86 89 78 93  ?Resp:  12 14   ?Temp: 98.4 ?F (36.9 ?C) 98.3 ?F (36.8 ?C) 98 ?F (36.7 ?C)   ?TempSrc: Oral Oral Oral   ?SpO2: 99% 97% 99%   ?Weight:      ?Height:      ? ? ?Fundus firm ?Perineum without swelling. ? ?No results found for this or any previous visit (from the past 24 hour(s)).  ? ?--/--/O POS (03/07 0035)/RNONIMM ? ?A/P Post partum day 1.  A2GDM controlled with metformin, now sugars are normal off meds.   ?Elevated pp BPS- pt started on procardia xl 30 mg.   CBC and CMP pending this am. ?Expect d/c tomorrow if BPs stable.   ?MMR ?Gwendolyn Rice ?  ?

## 2021-10-25 NOTE — Progress Notes (Signed)
LPN took pt blood pressure, 130/95, pt is asymptomatic. Dr. Philis Pique notified and ordered one time dose of Procardia tonight and one separate dose in the morning. See MAR. ?

## 2021-10-26 MED ORDER — NIFEDIPINE ER 60 MG PO TB24: 60.0000 mg | ORAL_TABLET | Freq: Every day | ORAL | 1 refills | Status: DC

## 2021-10-26 MED ORDER — IBUPROFEN 800 MG PO TABS: 800.0000 mg | ORAL_TABLET | Freq: Three times a day (TID) | ORAL | 1 refills | Status: DC | PRN

## 2021-10-26 MED ORDER — MEASLES, MUMPS & RUBELLA VAC IJ SOLR
0.5000 mL | Freq: Once | INTRAMUSCULAR | Status: AC
  Administered 2021-10-26: 0.5 mL via SUBCUTANEOUS
  Filled 2021-10-26: qty 0.5

## 2021-10-26 NOTE — Progress Notes (Signed)
POSTPARTUM PROGRESS NOTE ? ?Post Partum Day #2 ? ?Subjective: ? ?No acute events overnight.  Pt denies problems with ambulating, voiding or po intake.  She denies nausea or vomiting.  Pain is well controlled.  Stooling has improved from yesterday. Denies PreE symptoms, reviewed this morning BP is WNL after procardia 60.  Lochia Minimal.  ? ?Objective: ?Blood pressure 122/88, pulse (!) 102, temperature 98.7 ?F (37.1 ?C), temperature source Oral, resp. rate 17, height 5' 1"  (1.549 m), weight 86.4 kg, SpO2 97 %. ? ?Physical Exam:  ?General: alert, cooperative and no distress ?Lochia:normal flow ?Chest: CTAB ?Heart: RRR no m/r/g ?Abdomen: +BS, soft, nontender ?Uterine Fundus: firm, 2cm below umbilicus ?GU: suture intact, healing well, no purulent drainage ?Extremities: neg edema, neg calf TTP BL, neg Homans BL ? ?Recent Labs  ?  10/25/21 ?0175  ?HGB 10.5*  ?HCT 30.9*  ? ? ?Assessment/Plan: ? ?ASSESSMENT: Gwendolyn Rice is a 31 y.o. G1P1001 s/p SVD @ [redacted]w[redacted]d [facility] GDMA2. Elevated BP noted pp.  ? ?Discharge home and Breastfeeding ?HTN - asymptomatic, controlled on procardia 60XL, asx, labs WNL. Plan for BP check outpatient in 1wk followed by routine f/u. All questions answered ? ? LOS: 3 days  ? ? ? ?

## 2021-10-26 NOTE — Discharge Summary (Signed)
? ?  Postpartum Discharge Summary ? ?Date of Service updated ? ?   ?Patient Name: Gwendolyn Rice ?DOB: May 20, 1991 ?MRN: [id number] ? ?Date of admission: 10/23/2021 ?Delivery date:10/24/2021  ?Delivering provider: Allyn Kenner  ?Date of discharge: 10/26/2021 ? ?Admitting diagnosis: Pregnant and not yet delivered [Z34.90] ?Intrauterine pregnancy: [redacted]w[redacted]d    ?Secondary diagnosis:  Principal Problem: ?  Pregnant and not yet delivered ? ?Additional problems: GDMA2, GHTN    ?Discharge diagnosis: Term Pregnancy Delivered and GDM A2                                              ?Post partum procedures: none ?Augmentation: AROM and Pitocin ?Complications: None ? ?Hospital course: Induction of Labor With Vaginal Delivery   ?31y.o. yo G1P0000 at 333w5das admitted to the hospital 10/23/2021 for induction of labor.  Indication for induction: A2 DM.  Patient had an uncomplicated labor course as follows: ?Membrane Rupture Time/Date: 9:15 PM ,10/23/2021   ?Delivery Method:Vaginal, Spontaneous  ?Episiotomy: None  ?Lacerations:  2nd degree;Perineal  ?Details of delivery can be found in separate delivery note.  Patient had a routine postpartum course. Patient is discharged home 10/26/21. Of note, BP mildly elevated after delivery, persisted into PPD#1. PreE labs WNL, pt asx, BP controlled on Procardia 60XL, discharged with meds ? ?Newborn Data: ?Birth date:10/24/2021  ?Birth time:7:09 AM  ?Gender:Female  ?Living status:Living  ?Apgars:8 ,9  ?Weight:2980 g  ? ?MMR:Yes ? ? ?Physical exam  ?Vitals:  ? 10/25/21 2215 10/26/21 0500 10/26/21 0758 10/26/21 1054  ?BP: (!) 130/95 (!) 130/98 (!) 129/95 122/88  ?Pulse:  100 94 (!) 102  ?Resp:  18 16 17   ?Temp:  98 ?F (36.7 ?C) (!) 97.5 ?F (36.4 ?C) 98.7 ?F (37.1 ?C)  ?TempSrc:  Oral Oral Oral  ?SpO2:   98% 97%  ?Weight:      ?Height:      ? ?General: alert, cooperative, and no distress ?Lochia: appropriate ?Uterine Fundus: firm ?Incision: N/A ?DVT Evaluation: No evidence of DVT seen on physical  exam. ?Negative Homan's sign. ?No cords or calf tenderness. ?Labs: ?Lab Results  ?Component Value Date  ? WBC 13.1 (H) 10/25/2021  ? HGB 10.5 (L) 10/25/2021  ? HCT 30.9 (L) 10/25/2021  ? MCV 96.3 10/25/2021  ? PLT 239 10/25/2021  ? ?CMP Latest Ref Rng & Units 10/25/2021  ?Glucose 70 - 99 mg/dL 79  ?BUN 6 - 20 mg/dL 10  ?Creatinine 0.44 - 1.00 mg/dL 0.78  ?Sodium 135 - 145 mmol/L 139  ?Potassium 3.5 - 5.1 mmol/L 3.7  ?Chloride 98 - 111 mmol/L 105  ?CO2 22 - 32 mmol/L 24  ?Calcium 8.9 - 10.3 mg/dL 8.9  ?Total Protein 6.5 - 8.1 g/dL 5.5(L)  ?Total Bilirubin 0.3 - 1.2 mg/dL 0.5  ?Alkaline Phos 38 - 126 U/L 120  ?AST 15 - 41 U/L 31  ?ALT 0 - 44 U/L 19  ? ?Edinburgh Score: ?Edinburgh Postnatal Depression Scale Screening Tool 10/24/2021  ?I have been able to laugh and see the funny side of things. 0  ?I have looked forward with enjoyment to things. 0  ?I have blamed myself unnecessarily when things went wrong. 1  ?I have been anxious or worried for no good reason. 1  ?I have felt scared or panicky for no good reason. 0  ?Things have been getting on top of me. 0  ?  I have been so unhappy that I have had difficulty sleeping. 0  ?I have felt sad or miserable. 0  ?I have been so unhappy that I have been crying. 0  ?The thought of harming myself has occurred to me. 0  ?Edinburgh Postnatal Depression Scale Total 2  ? ? ? ? ?After visit meds:  ?Allergies as of 10/26/2021   ? ?   Reactions  ? Amoxicillin Swelling  ? Ciprofloxacin Swelling  ? Penicillins Other (See Comments)  ? Unknown reaction. ?as patient had a PCN reaction causing immediate rash, facial/tongue/throat swelling, SOB or lightheadedness with hypotension: unknown ?Has patient had a PCN reaction causing severe rash involving mucus membranes or skin necrosis: unknown ?Has patient had a PCN reaction that required hospitalization: no ?Has patient had a PCN reaction occurring within the last 10 years: yes ?If all of the above answers are "NO", then may proceed with  Cephalosporin use.  ? Sulfa Antibiotics Swelling  ? Flagyl [metronidazole] Nausea And Vomiting  ? Macrobid [nitrofurantoin Monohyd Macro]   ? States she gets very sick  ? ?  ? ?  ?Medication List  ?  ? ?STOP taking these medications   ? ?ferrous sulfate 325 (65 FE) MG EC tablet ?  ?folic acid 1 MG tablet ?Commonly known as: FOLVITE ?  ?metFORMIN 500 MG tablet ?Commonly known as: GLUCOPHAGE ?  ?omeprazole 20 MG capsule ?Commonly known as: PRILOSEC ?  ?promethazine-dextromethorphan 6.25-15 MG/5ML syrup ?Commonly known as: PROMETHAZINE-DM ?  ?Tylenol 325 MG tablet ?Generic drug: acetaminophen ?  ?valACYclovir 500 MG tablet ?Commonly known as: VALTREX ?  ? ?  ? ?TAKE these medications   ? ?azaTHIOprine 50 MG tablet ?Commonly known as: IMURAN ?Take 150 mg by mouth daily. ?  ?ibuprofen 800 MG tablet ?Commonly known as: ADVIL ?Take 1 tablet (800 mg total) by mouth every 8 (eight) hours as needed. ?  ?NIFEdipine 60 MG 24 hr tablet ?Commonly known as: ADALAT CC ?Take 1 tablet (60 mg total) by mouth daily. ?Start taking on: October 27, 2021 ?  ?Prenatal Gummies 0.18-25 MG Chew ?Chew 1 tablet by mouth daily. ?  ?PROBIOTIC PO ?Take 1 tablet by mouth. ?  ?ZYRTEC PO ?Take 1 tablet by mouth. ?  ? ?  ? ? ? ?Discharge home in stable condition ?Infant Feeding: Breast ?Infant Disposition:home with mother ?Discharge instruction: per After Visit Summary and Postpartum booklet. ?Activity: Advance as tolerated. Pelvic rest for 6 weeks.  ?Diet: routine diet ?Anticipated Birth Control: Unsure ?Postpartum Appointment:6 weeks ?Additional Postpartum F/U: BP check 1 week ?Follow up Visit:[facility] ? ?10/26/2021 ?Deliah Boston, MD ? ? ?

## 2021-10-27 ENCOUNTER — Other Ambulatory Visit: Payer: Self-pay | Admitting: Internal Medicine

## 2021-10-29 NOTE — Telephone Encounter (Signed)
Requested medication (s) are due for refill today: yes ? ?Requested medication (s) are on the active medication list: no ? ?Last refill:  08/04/21 ? ?Future visit scheduled: no ? ?Notes to clinic:  rx was dc'd on 10/26/21 by another provider. Please advise ? ? ?  ?Requested Prescriptions  ?Pending Prescriptions Disp Refills  ? omeprazole (PRILOSEC) 20 MG capsule [Pharmacy Med Name: OMEPRAZOLE DR 20 MG CAPSULE] 90 capsule 1  ?  Sig: TAKE 1 CAPSULE BY MOUTH EVERY DAY  ?  ? Gastroenterology: Proton Pump Inhibitors Passed - 10/27/2021 11:08 AM  ?  ?  Passed - Valid encounter within last 12 months  ?  Recent Outpatient Visits   ? ?      ? 4 weeks ago Viral URI with cough  ? [facility] [place], [place] W, NP  ? 8 months ago Heart palpitations  ? [facility] [place], [place] W, NP  ? 3 years ago Bloody diarrhea  ? [facility], PA-C  ? 3 years ago Lower abdominal pain  ? [facility], PA-C  ? 4 years ago Rash and nonspecific skin eruption  ? [facility] at [facility], [place], [place]  ? ?  ?  ? ?  ?  ?  ? ?

## 2021-11-03 ENCOUNTER — Telehealth (HOSPITAL_COMMUNITY): Payer: Self-pay

## 2021-11-03 NOTE — Telephone Encounter (Signed)
"  I'm doing good. My ankles and legs are better. How long do the stitches take to heal. I know the dissolve." RN reviewed peri care and healing process with patient. Patient declines any other questions or concerns about her healing.   ? ?"She's good, asleep right now. She is a good baby. We went to the pediatrician for a check up yesterday and everything was good. She sleeps in a bassinet, we have a crib as well for her." RN reviewed ABC's of safe sleep with patient. Patient declines any questions or concerns about baby. ? ?EPDS score is 2. ? Jerald Kief ?11/03/2021,1502 ?

## 2021-11-22 DIAGNOSIS — Z8489 Family history of other specified conditions: Secondary | ICD-10-CM | POA: Diagnosis not present

## 2021-12-31 ENCOUNTER — Ambulatory Visit
Admission: RE | Admit: 2021-12-31 | Discharge: 2021-12-31 | Disposition: A | Discharge status: Home / Self Care | Payer: BC Managed Care – PPO | Attending: Internal Medicine | Admitting: Internal Medicine

## 2021-12-31 ENCOUNTER — Ambulatory Visit: Payer: Self-pay | Admitting: Internal Medicine

## 2021-12-31 ENCOUNTER — Ambulatory Visit
Admission: RE | Admit: 2021-12-31 | Discharge: 2021-12-31 | Disposition: A | Discharge status: Home / Self Care | Payer: BC Managed Care – PPO | Source: Ambulatory Visit | Attending: Internal Medicine | Admitting: Internal Medicine

## 2021-12-31 ENCOUNTER — Encounter: Payer: Self-pay | Admitting: Internal Medicine

## 2021-12-31 VITALS — BP 116/64 | HR 86 | Temp 96.8°F | Wt 159.0 lb

## 2021-12-31 DIAGNOSIS — M79642 Pain in left hand: Secondary | ICD-10-CM | POA: Insufficient documentation

## 2021-12-31 DIAGNOSIS — G8929 Other chronic pain: Secondary | ICD-10-CM | POA: Insufficient documentation

## 2021-12-31 NOTE — Progress Notes (Signed)
? ?Subjective:  ? ? Patient ID: Gwendolyn Rice, female    DOB: [birth year], 31 y.o.   MRN: [id number] ? ?HPI ? ?Patient presents to clinic today with complaint of left wrist/hand pain.  This started 5 years ago after an accident at work but seems to have worsened in the last few months.  She describes the pain as sore and achy but can be sharp and stabbing with certain movements.  She denies numbness or tingling in the left hand but does feel some weakness.  She takes Tylenol OTC as needed with minimal relief of symptoms.  She is right-handed. ? ?Review of Systems ? ?Past Medical History:  ?Diagnosis Date  ? Anginal pain (Altha)   ? since childhoood, inflammation around the chest  ? Chlamydia 12/2016, 05/2017  ? Colitis   ? Diarrhea   ? Gestational diabetes   ? 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 LEEP 07/2013  ? History of kidney stones   ? Vaginal Pap smear, abnormal   ? ? ?Current Outpatient Medications  ?Medication Sig Dispense Refill  ? azaTHIOprine (IMURAN) 50 MG tablet Take 150 mg by mouth daily.    ? Cetirizine HCl (ZYRTEC PO) Take 1 tablet by mouth.    ? ibuprofen (ADVIL) 800 MG tablet Take 1 tablet (800 mg total) by mouth every 8 (eight) hours as needed. 60 tablet 1  ? NIFEdipine (ADALAT CC) 60 MG 24 hr tablet Take 1 tablet (60 mg total) by mouth daily. 30 tablet 1  ? omeprazole (PRILOSEC) 20 MG capsule TAKE 1 CAPSULE BY MOUTH EVERY DAY 90 capsule 1  ? Prenatal MV & Min w/FA-DHA (PRENATAL GUMMIES) 0.18-25 MG CHEW Chew 1 tablet by mouth daily.    ? Probiotic Product (PROBIOTIC PO) Take 1 tablet by mouth.    ? ?No current facility-administered medications for this visit.  ? ? ?Allergies  ?Allergen Reactions  ? Amoxicillin Swelling  ? Ciprofloxacin Swelling  ? Penicillins Other (See Comments)  ?  Unknown reaction. ?as patient had a PCN reaction causing immediate rash, facial/tongue/throat swelling, SOB or lightheadedness with hypotension: unknown ?Has patient had a PCN  reaction causing severe rash involving mucus membranes or skin necrosis: unknown ?Has patient had a PCN reaction that required hospitalization: no ?Has patient had a PCN reaction occurring within the last 10 years: yes ?If all of the above answers are "NO", then may proceed with Cephalosporin use. ?  ? Sulfa Antibiotics Swelling  ? Flagyl [Metronidazole] Nausea And Vomiting  ? Macrobid [employer] Macro]   ?  States she gets very sick  ? ? ?Family History  ?Problem Relation Age of Onset  ? Heart attack Maternal Grandmother   ? Cancer Paternal Aunt   ? ? ?Social History  ? ?Socioeconomic History  ? Marital status: Significant Other  ?  Spouse name: Patsy [place]  ? Number of children: 0  ? Years of education: Not on file  ? Highest education level: Not on file  ?Occupational History  ? Occupation: assembly line  ?  Comment: Nurse, adult  ?Tobacco Use  ? Smoking status: Never  ? Smokeless tobacco: Never  ?Vaping Use  ? Vaping Use: Never used  ?Substance and Sexual Activity  ? Alcohol use: Yes  ?  Alcohol/week: 0.0 standard drinks  ?  Comment: Rare  ? Drug use: Never  ? Sexual activity: Yes  ?  Partners: Male  ?  Birth control/protection: None  ?Other Topics Concern  ? Not on file  ?  Social History Narrative  ? Lives with significant other  ? ?Social Determinants of Health  ? ?Financial Resource Strain: Not on file  ?Food Insecurity: Not on file  ?Transportation Needs: Not on file  ?Physical Activity: Not on file  ?Stress: Not on file  ?Social Connections: Not on file  ?Intimate Partner Violence: Not on file  ? ? ? ?Constitutional: Denies fever, malaise, fatigue, headache or abrupt weight changes.  ?Respiratory: Denies difficulty breathing, shortness of breath, cough or sputum production.   ?Cardiovascular: Denies chest pain, chest tightness, palpitations or swelling in the hands or feet.  ?Musculoskeletal: Pt reports left wrist/hand pain, weakness, and decreased range of motion. Denies difficulty with  gait, muscle pain or joint swelling.  ?Skin: Denies redness, rashes, lesions or ulcercations.  ?Neurological: Denies numbness, tingling or problems with coordination.  ?Psych: Denies anxiety, depression, SI/HI. ? ?No other specific complaints in a complete review of systems (except as listed in HPI above). ? ?   ?Objective:  ? Physical Exam ? ?BP 116/64 (BP Location: Left Arm, Patient Position: Sitting, Cuff Size: Large)   Pulse 86   Temp (!) 96.8 ?F (36 ?C) (Temporal)   Wt 159 lb (72.1 kg)   LMP  (LMP Unknown)   SpO2 99%   BMI 30.04 kg/m?  ? ?Wt Readings from Last 3 Encounters:  ?10/23/21 190 lb 8 oz (86.4 kg)  ?10/08/21 180 lb (81.6 kg)  ?04/05/21 172 lb (78 kg)  ? ? ?General: Appears her stated age, obese, in NAD. ?Skin: Warm, dry and intact.  ?HEENT: Head: normal shape and size; Eyes: sclera white, no icterus, conjunctiva pink, PERRLA and EOMs intact;  ?Cardiovascular: Normal rate and rhythm. S1,S2 noted.  No murmur, rubs or gallops noted.  ?Pulmonary/Chest: Normal effort and positive vesicular breath sounds. No respiratory distress. No wheezes, rales or ronchi noted.  ?Musculoskeletal: Decreased flexion, extension and rotation of the left wrist. No joint swelling noted. Pain with palpation over the left lateral carpals, 1st and 2nd metacarpals. No joint swelling noted. Hand grips equal. ?Neurological: Negative Phalen's. Negative Tinel's. Coordination normal.  ? ? ?BMET ?   ?Component Value Date/Time  ? NA 139 10/25/2021 0654  ? NA 141 12/10/2017 1723  ? K 3.7 10/25/2021 0654  ? CL 105 10/25/2021 0654  ? CO2 24 10/25/2021 0654  ? GLUCOSE 79 10/25/2021 0654  ? BUN 10 10/25/2021 0654  ? BUN 11 12/10/2017 1723  ? CREATININE 0.78 10/25/2021 0654  ? CREATININE 0.61 02/12/2021 1147  ? CALCIUM 8.9 10/25/2021 0654  ? GFRNONAA >60 10/25/2021 0654  ? GFRNONAA 123 02/12/2021 1147  ? GFRAA 142 02/12/2021 1147  ? ? ?Lipid Panel  ?   ?Component Value Date/Time  ? CHOL 171 12/28/2019 1620  ? TRIG 45.0 12/28/2019 1620  ?  HDL 59.40 12/28/2019 1620  ? CHOLHDL 3 12/28/2019 1620  ? VLDL 9.0 12/28/2019 1620  ? LDLCALC 103 (H) 12/28/2019 1620  ? ? ?CBC ?   ?Component Value Date/Time  ? WBC 13.1 (H) 10/25/2021 0554  ? RBC 3.21 (L) 10/25/2021 0554  ? HGB 10.5 (L) 10/25/2021 0554  ? HCT 30.9 (L) 10/25/2021 0554  ? PLT 239 10/25/2021 0554  ? MCV 96.3 10/25/2021 0554  ? MCV 87.4 12/17/2017 1713  ? MCH 32.7 10/25/2021 0554  ? MCHC 34.0 10/25/2021 0554  ? RDW 15.5 10/25/2021 0554  ? LYMPHSABS 2.2 12/09/2018 1556  ? MONOABS 0.7 12/09/2018 1556  ? EOSABS 0.5 12/09/2018 1556  ? BASOSABS 0.1 12/09/2018 1556  ? ? ?  Hgb A1C ?Lab Results  ?Component Value Date  ? HGBA1C 5.4 12/28/2019  ? ? ? ? ? ? ?   ?Assessment & Plan:  ? ?Chronic Left Hand Pain: ? ?Xray left hand today ?Recommend Ibuprofen 400 mg BID prn -consume with food ?Advised her to wear a compression sleeve as needed for comfort ? ?Will follow up after xray with further recommendation and treatment plans. ? ?Webb Silversmith, NP ? ?

## 2021-12-31 NOTE — Patient Instructions (Signed)
Hand Exercises ?Hand exercises can be helpful for almost anyone. These exercises can strengthen the hands, improve flexibility and movement, and increase blood flow to the hands. These results can make work and daily tasks easier. ?Hand exercises can be especially helpful for people who have joint pain from arthritis or have nerve damage from overuse (carpal tunnel syndrome). ?These exercises can also help people who have injured a hand. ?Exercises ?Most of these hand exercises are gentle stretching and motion exercises. It is usually safe to do them often throughout the day. Warming up your hands before exercise may help to reduce stiffness. You can do this with gentle massage or by placing your hands in warm water for 10-15 minutes. ?It is normal to feel some stretching, pulling, tightness, or mild discomfort as you begin new exercises. This will gradually improve. Stop an exercise right away if you feel sudden, severe pain or your pain gets worse. Ask your health care provider which exercises are best for you. ?Knuckle bend or "claw" fist ? ?Stand or sit with your arm, hand, and all five fingers pointed straight up. Make sure to keep your wrist straight during the exercise. ?Gently bend your fingers down toward your palm until the tips of your fingers are touching the top of your palm. Keep your big knuckle straight and just bend the small knuckles in your fingers. ?Hold this position for __________ seconds. ?Straighten (extend) your fingers back to the starting position. ?Repeat this exercise 5-10 times with each hand. ?Full finger fist ? ?Stand or sit with your arm, hand, and all five fingers pointed straight up. Make sure to keep your wrist straight during the exercise. ?Gently bend your fingers into your palm until the tips of your fingers are touching the middle of your palm. ?Hold this position for __________ seconds. ?Extend your fingers back to the starting position, stretching every joint fully. ?Repeat  this exercise 5-10 times with each hand. ?Straight fist ?Stand or sit with your arm, hand, and all five fingers pointed straight up. Make sure to keep your wrist straight during the exercise. ?Gently bend your fingers at the big knuckle, where your fingers meet your hand, and the middle knuckle. Keep the knuckle at the tips of your fingers straight and try to touch the bottom of your palm. ?Hold this position for __________ seconds. ?Extend your fingers back to the starting position, stretching every joint fully. ?Repeat this exercise 5-10 times with each hand. ?Tabletop ? ?Stand or sit with your arm, hand, and all five fingers pointed straight up. Make sure to keep your wrist straight during the exercise. ?Gently bend your fingers at the big knuckle, where your fingers meet your hand, as far down as you can while keeping the small knuckles in your fingers straight. Think of forming a tabletop with your fingers. ?Hold this position for __________ seconds. ?Extend your fingers back to the starting position, stretching every joint fully. ?Repeat this exercise 5-10 times with each hand. ?Finger spread ? ?Place your hand flat on a table with your palm facing down. Make sure your wrist stays straight as you do this exercise. ?Spread your fingers and thumb apart from each other as far as you can until you feel a gentle stretch. Hold this position for __________ seconds. ?Bring your fingers and thumb tight together again. Hold this position for __________ seconds. ?Repeat this exercise 5-10 times with each hand. ?Making circles ? ?Stand or sit with your arm, hand, and all five fingers pointed  straight up. Make sure to keep your wrist straight during the exercise. ?Make a circle by touching the tip of your thumb to the tip of your index finger. ?Hold for __________ seconds. Then open your hand wide. ?Repeat this motion with your thumb and each finger on your hand. ?Repeat this exercise 5-10 times with each hand. ?Thumb  motion ? ?Sit with your forearm resting on a table and your wrist straight. Your thumb should be facing up toward the ceiling. Keep your fingers relaxed as you move your thumb. ?Lift your thumb up as high as you can toward the ceiling. Hold for __________ seconds. ?Bend your thumb across your palm as far as you can, reaching the tip of your thumb for the small finger (pinkie) side of your palm. Hold for __________ seconds. ?Repeat this exercise 5-10 times with each hand. ?Grip strengthening ? ?Hold a stress ball or other soft ball in the middle of your hand. ?Slowly increase the pressure, squeezing the ball as much as you can without causing pain. Think of bringing the tips of your fingers into the middle of your palm. All of your finger joints should bend when doing this exercise. ?Hold your squeeze for __________ seconds, then relax. ?Repeat this exercise 5-10 times with each hand. ?Contact a health care provider if: ?Your hand pain or discomfort gets much worse when you do an exercise. ?Your hand pain or discomfort does not improve within 2 hours after you exercise. ?If you have any of these problems, stop doing these exercises right away. Do not do them again unless your health care provider says that you can. ?Get help right away if: ?You develop sudden, severe hand pain or swelling. If this happens, stop doing these exercises right away. Do not do them again unless your health care provider says that you can. ?This information is not intended to replace advice given to you by your health care provider. Make sure you discuss any questions you have with your health care provider. ?Document Revised: 11/23/2020 Document Reviewed: 11/23/2020 ?Elsevier Patient Education ? [place]. ? ?

## 2022-01-18 DIAGNOSIS — Z8632 Personal history of gestational diabetes: Secondary | ICD-10-CM | POA: Diagnosis not present

## 2022-02-12 ENCOUNTER — Encounter: Payer: Self-pay | Admitting: Internal Medicine

## 2022-02-15 DIAGNOSIS — Z6830 Body mass index (BMI) 30.0-30.9, adult: Secondary | ICD-10-CM | POA: Diagnosis not present

## 2022-02-15 DIAGNOSIS — Z3040 Encounter for surveillance of contraceptives, unspecified: Secondary | ICD-10-CM | POA: Diagnosis not present

## 2022-02-21 ENCOUNTER — Ambulatory Visit (INDEPENDENT_AMBULATORY_CARE_PROVIDER_SITE_OTHER): Payer: Self-pay | Admitting: Internal Medicine

## 2022-02-21 ENCOUNTER — Encounter: Payer: Self-pay | Admitting: Internal Medicine

## 2022-02-21 VITALS — BP 118/72 | HR 84 | Temp 97.1°F | Wt 163.0 lb

## 2022-02-21 DIAGNOSIS — R109 Unspecified abdominal pain: Secondary | ICD-10-CM | POA: Diagnosis not present

## 2022-02-21 DIAGNOSIS — K529 Noninfective gastroenteritis and colitis, unspecified: Secondary | ICD-10-CM

## 2022-02-21 MED ORDER — DICYCLOMINE HCL 10 MG PO CAPS: 10.0000 mg | ORAL_CAPSULE | Freq: Three times a day (TID) | ORAL | 0 refills | Status: DC

## 2022-02-21 NOTE — Progress Notes (Signed)
Subjective:    Patient ID: Gwendolyn Rice, female    DOB: [birth year], 31 y.o.   MRN: [id number]  HPI  Patient presents to clinic today requesting a referral to GI. She thinks she has irritable bowel and colitis. She reports abdominal cramping, fecal urgency with incontinence and loose stools. She occasionally has blood in her stool. She takes Tylenol as needed. She did see a GI doctor about 1 year ago but would like a referral for a second opinion.  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     ibuprofen (ADVIL) 800 MG tablet Take 1 tablet (800 mg total) by mouth every 8 (eight) hours as needed. 60 tablet 1   No current facility-administered medications for this visit.    Allergies  Allergen Reactions   Amoxicillin Swelling   Ciprofloxacin Swelling   Penicillins Other (See Comments)    Unknown reaction. as patient had a PCN reaction causing immediate rash, facial/tongue/throat swelling, SOB or lightheadedness with hypotension: unknown Has patient had a PCN reaction causing severe rash involving mucus membranes or skin necrosis: unknown Has patient had a PCN reaction that required hospitalization: no Has patient had a PCN reaction occurring within the last 10 years: yes If all of the above answers are "NO", then may proceed with Cephalosporin use.    Sulfa Antibiotics Swelling   Flagyl [Metronidazole] Nausea And Vomiting   Macrobid [Nitrofurantoin Monohyd Macro]     States she gets very sick    Family History  Problem Relation Age of Onset    Heart attack Maternal Grandmother    Cancer Paternal Aunt     Social History   Socioeconomic History   Marital status: Significant Other    Spouse name: Quinton   Number of children: 0   Years of education: Not on file   Highest education level: Not on file  Occupational History   Occupation: assembly line    Comment: Nurse, adult  Tobacco Use   Smoking status: Never   Smokeless tobacco: Never  Vaping Use   Vaping Use: Never used  Substance and Sexual Activity   Alcohol use: Yes    Alcohol/week: 0.0 standard drinks of alcohol    Comment: Rare   Drug use: Never   Sexual activity: Yes    Partners: Male    Birth control/protection: None  Other Topics Concern   Not on file  Social History Narrative   Lives with significant other   Social Determinants of Health   Financial Resource Strain: Not on file  Food Insecurity: Not on file  Transportation Needs: Not on file  Physical Activity: Not on file  Stress: Not on file  Social Connections: Not on file  Intimate Partner Violence: Not on file     Constitutional: Denies fever, malaise, fatigue, headache or abrupt weight changes.  Respiratory: Denies difficulty breathing, shortness of breath, cough or sputum production.   Cardiovascular: Denies chest pain, chest tightness, palpitations or swelling in the hands or feet.  Gastrointestinal: Pt reports  abdominal cramping, loose stools, fecal urgency and incontinence. Denies bloating, constipation.   No other specific complaints in a complete review of systems (except as listed in HPI above).     Objective:   Physical Exam BP 118/72 (BP Location: Left Arm, Patient Position: Sitting, Cuff Size: Normal)   Pulse 84   Temp (!) 97.1 F (36.2 C) (Temporal)   Wt 163 lb (73.9 kg)   SpO2 99%   BMI 30.80 kg/m   Wt Readings from Last 3 Encounters:  12/31/21 159 lb (72.1 kg)  10/23/21 190 lb 8 oz (86.4 kg)  10/08/21 180 lb (81.6 kg)    General: Appears her stated  age, well developed, well nourished in NAD. Cardiovascular: Normal rate and rhythm. S1,S2 noted.  No murmur, rubs or gallops noted.  Pulmonary/Chest: Normal effort and positive vesicular breath sounds. No respiratory distress. No wheezes, rales or ronchi noted.  Abdomen: Soft and nontender. Normal bowel sounds. No distention or masses noted.  Neurological: Alert and oriented.    BMET    Component Value Date/Time   NA 139 10/25/2021 0654   NA 141 12/10/2017 1723   K 3.7 10/25/2021 0654   CL 105 10/25/2021 0654   CO2 24 10/25/2021 0654   GLUCOSE 79 10/25/2021 0654   BUN 10 10/25/2021 0654   BUN 11 12/10/2017 1723   CREATININE 0.78 10/25/2021 0654   CREATININE 0.61 02/12/2021 1147   CALCIUM 8.9 10/25/2021 0654   GFRNONAA >60 10/25/2021 0654   GFRNONAA 123 02/12/2021 1147   GFRAA 142 02/12/2021 1147    Lipid Panel     Component Value Date/Time   CHOL 171 12/28/2019 1620   TRIG 45.0 12/28/2019 1620   HDL 59.40 12/28/2019 1620   CHOLHDL 3 12/28/2019 1620   VLDL 9.0 12/28/2019 1620   LDLCALC 103 (H) 12/28/2019 1620    CBC    Component Value Date/Time   WBC 13.1 (H) 10/25/2021 0554   RBC 3.21 (L) 10/25/2021 0554   HGB 10.5 (L) 10/25/2021 0554   HCT 30.9 (L) 10/25/2021 0554   PLT 239 10/25/2021 0554   MCV 96.3 10/25/2021 0554   MCV 87.4 12/17/2017 1713   MCH 32.7 10/25/2021 0554   MCHC 34.0 10/25/2021 0554   RDW 15.5 10/25/2021 0554   LYMPHSABS 2.2 12/09/2018 1556   MONOABS 0.7 12/09/2018 1556   EOSABS 0.5 12/09/2018 1556   BASOSABS 0.1 12/09/2018 1556    Hgb A1C Lab Results  Component Value Date   HGBA1C 5.4 12/28/2019            Assessment & Plan:   Colitis, Abdominal Cramping:  RX for Bentyl 10 mg TID prn Referral to GI for further evaluation and treatment  RTC in 3 months for your annual exam Webb Silversmith, NP

## 2022-02-21 NOTE — Patient Instructions (Signed)
Diet for Irritable Bowel Syndrome When you have irritable bowel syndrome (IBS), it is very important to follow the eating habits that are best for your condition. IBS may cause various symptoms, such as pain in the abdomen, constipation, or diarrhea. Choosing the right foods can help to ease the discomfort from these symptoms. Work with your health care provider and dietitian to find the eating plan that will help to control your symptoms. What are tips for following this plan?  Keep a food diary. This will help you identify foods that cause symptoms. Write down: What you eat and when you eat it. What symptoms you have. When symptoms occur in relation to your meals, such as "pain in abdomen 2 hours after dinner." Eat your meals slowly and in a relaxed setting. Aim to eat 5-6 small meals per day. Do not skip meals. Drink enough fluid to keep your urine pale yellow. Ask your health care provider if you should take an over-the-counter probiotic to help restore healthy bacteria in your gut (digestive tract). Probiotics are foods that contain good bacteria and yeasts. Your dietitian may have specific dietary recommendations for you based on your symptoms. Your dietitian may recommend that you: Avoid foods that cause symptoms. Talk with your dietitian about other ways to get the same nutrients that are in those problem foods. Avoid foods with gluten. Gluten is a protein that is found in rye, wheat, and barley. Eat more foods that contain soluble fiber. Examples of foods with high soluble fiber include oats, seeds, and certain fruits and vegetables. Take a fiber supplement if told by your dietitian. Reduce or avoid certain foods called FODMAPs. These are foods that contain sugars that are hard for some people to digest. Ask your health care provider which foods to avoid. What foods should I avoid? The following are some foods and drinks that may make your symptoms worse: Fatty foods, such as french  fries. Foods that contain gluten, such as pasta and cereal. Dairy products, such as milk, cheese, and ice cream. Spicy foods. Alcohol. Products with caffeine, such as coffee, tea, or chocolate. Carbonated drinks, such as soda. Foods that are high in FODMAPs. These include certain fruits and vegetables. Products with sweeteners such as honey, high fructose corn syrup, sorbitol, and mannitol. The items listed above may not be a complete list of foods and beverages you should avoid. Contact a dietitian for more information. What foods are good sources of fiber? Your health care provider or dietitian may recommend that you eat more foods that contain fiber. Fiber can help to reduce constipation and other IBS symptoms. Add foods with fiber to your diet a little at a time so your body can get used to them. Too much fiber at one time might cause gas and swelling of your abdomen. The following are some foods that are good sources of fiber: Berries, such as raspberries, strawberries, and blueberries. Tomatoes. Carrots. Brown rice. Oats. Seeds, such as chia and pumpkin seeds. The items listed above may not be a complete list of recommended sources of fiber. Contact your dietitian for more options. Where to find more information International Foundation for Functional Gastrointestinal Disorders: aboutibs.[employer] of Diabetes and Digestive and Kidney Diseases: [employer] Summary When you have irritable bowel syndrome (IBS), it is very important to follow the eating habits that are best for your condition. IBS may cause various symptoms, such as pain in the abdomen, constipation, or diarrhea. Choosing the right foods can help to ease the  discomfort that comes from symptoms. Your health care provider or dietitian may recommend that you eat more foods that contain fiber. Keep a food diary. This will help you identify foods that cause symptoms. This information is not intended to replace  advice given to you by your health care provider. Make sure you discuss any questions you have with your health care provider. Document Revised: 07/17/2021 Document Reviewed: 07/17/2021 Elsevier Patient Education  [place].

## 2022-03-16 ENCOUNTER — Other Ambulatory Visit: Payer: Self-pay | Admitting: Internal Medicine

## 2022-03-18 NOTE — Telephone Encounter (Signed)
Requested Prescriptions  Pending Prescriptions Disp Refills  . dicyclomine (BENTYL) 10 MG capsule [Pharmacy Med Name: DICYCLOMINE 10 MG CAPSULE] 90 capsule 2    Sig: TAKE 1 CAPSULE (10 MG TOTAL) BY MOUTH 4 TIMES A DAY BEFORE MEALS AND AT BEDTIME     Gastroenterology:  Antispasmodic Agents Passed - 03/16/2022  9:44 AM      Passed - Valid encounter within last 12 months    Recent Outpatient Visits          3 weeks ago Abdominal cramps   [facility] [place], [place] W, NP   2 months ago Chronic hand pain, left   [facility] [place], Coralie Keens, NP   5 months ago Viral URI with cough   [facility] [place], Coralie Keens, NP   1 year ago Heart palpitations   [facility] [place], Coralie Keens, NP   4 years ago Bloody diarrhea   [facility] [place], PA-C      Future Appointments            In 2 months [place], Coralie Keens, NP [facility], [facility]

## 2022-03-29 DIAGNOSIS — R1030 Lower abdominal pain, unspecified: Secondary | ICD-10-CM | POA: Diagnosis not present

## 2022-03-29 DIAGNOSIS — K5100 Ulcerative (chronic) pancolitis without complications: Secondary | ICD-10-CM | POA: Diagnosis not present

## 2022-04-17 DIAGNOSIS — R1084 Generalized abdominal pain: Secondary | ICD-10-CM | POA: Diagnosis not present

## 2022-04-26 DIAGNOSIS — R1030 Lower abdominal pain, unspecified: Secondary | ICD-10-CM | POA: Diagnosis not present

## 2022-05-03 ENCOUNTER — Other Ambulatory Visit: Payer: Self-pay | Admitting: Internal Medicine

## 2022-05-03 DIAGNOSIS — Z6831 Body mass index (BMI) 31.0-31.9, adult: Secondary | ICD-10-CM | POA: Diagnosis not present

## 2022-05-03 DIAGNOSIS — R300 Dysuria: Secondary | ICD-10-CM | POA: Diagnosis not present

## 2022-05-03 DIAGNOSIS — Z01419 Encounter for gynecological examination (general) (routine) without abnormal findings: Secondary | ICD-10-CM | POA: Diagnosis not present

## 2022-05-03 DIAGNOSIS — R350 Frequency of micturition: Secondary | ICD-10-CM | POA: Diagnosis not present

## 2022-05-03 DIAGNOSIS — Z124 Encounter for screening for malignant neoplasm of cervix: Secondary | ICD-10-CM | POA: Diagnosis not present

## 2022-05-03 NOTE — Telephone Encounter (Signed)
Requested Prescriptions  Pending Prescriptions Disp Refills  . omeprazole (PRILOSEC) 20 MG capsule [Pharmacy Med Name: OMEPRAZOLE DR 20 MG CAPSULE] 90 capsule 0    Sig: TAKE 1 CAPSULE BY MOUTH EVERY DAY     Gastroenterology: Proton Pump Inhibitors Passed - 05/03/2022  2:27 AM      Passed - Valid encounter within last 12 months    Recent Outpatient Visits          2 months ago Abdominal cramps   [facility] [place], [place] W, NP   4 months ago Chronic hand pain, left   [facility] [place], Coralie Keens, NP   7 months ago Viral URI with cough   [facility] [place], Coralie Keens, NP   1 year ago Heart palpitations   [facility] [place], Coralie Keens, NP   4 years ago Bloody diarrhea   [facility] [place], PA-C      Future Appointments            In 3 weeks Garnette Gunner, Coralie Keens, NP [facility], [facility]

## 2022-05-28 ENCOUNTER — Encounter: Payer: Self-pay | Admitting: Internal Medicine

## 2022-05-28 ENCOUNTER — Ambulatory Visit (INDEPENDENT_AMBULATORY_CARE_PROVIDER_SITE_OTHER): Payer: BC Managed Care – PPO | Admitting: Internal Medicine

## 2022-05-28 VITALS — BP 106/72 | HR 84 | Temp 96.9°F | Ht 62.0 in | Wt 166.0 lb

## 2022-05-28 DIAGNOSIS — Z6830 Body mass index (BMI) 30.0-30.9, adult: Secondary | ICD-10-CM

## 2022-05-28 DIAGNOSIS — Z0001 Encounter for general adult medical examination with abnormal findings: Secondary | ICD-10-CM | POA: Diagnosis not present

## 2022-05-28 DIAGNOSIS — E6609 Other obesity due to excess calories: Secondary | ICD-10-CM

## 2022-05-28 DIAGNOSIS — R7309 Other abnormal glucose: Secondary | ICD-10-CM

## 2022-05-28 DIAGNOSIS — R945 Abnormal results of liver function studies: Secondary | ICD-10-CM | POA: Diagnosis not present

## 2022-05-28 NOTE — Progress Notes (Signed)
Subjective:    Patient ID: Gwendolyn Rice, female    DOB: Jan 09, 1991, 31 y.o.   MRN: [id number]  HPI  Patient presents to clinic today for her annual exam.  Flu: never Tetanus: 03/2016 COVID: Never Pap smear: 07/2020 Dentist: as needed  Diet: She does eat meat. She consumes fruits and veggies. She does eat some fried foods. She drinks mostly sprite. 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     dicyclomine (BENTYL) 10 MG capsule TAKE 1 CAPSULE (10 MG TOTAL) BY MOUTH 4 TIMES A DAY BEFORE MEALS AND AT BEDTIME 90 capsule 2   omeprazole (PRILOSEC) 20 MG capsule TAKE 1 CAPSULE BY MOUTH EVERY DAY 90 capsule 0   No current facility-administered medications for this visit.    Allergies  Allergen Reactions   Amoxicillin Swelling   Ciprofloxacin Swelling   Penicillins Other (See Comments)    Unknown reaction. as patient had a PCN reaction causing immediate rash, facial/tongue/throat swelling, SOB or lightheadedness with hypotension: unknown Has patient had a PCN reaction causing severe rash involving mucus membranes or skin necrosis: unknown Has patient had a PCN reaction that required hospitalization: no Has patient had a PCN reaction occurring within the last 10 years: yes If all of the above answers are "NO", then may proceed with Cephalosporin use.    Sulfa Antibiotics Swelling   Flagyl [Metronidazole] Nausea And Vomiting   Macrobid [Nitrofurantoin Monohyd Macro]     States she gets very sick    Family History   Problem Relation Age of Onset   Heart attack Maternal Grandmother    Cancer Paternal Aunt     Social History   Socioeconomic History   Marital status: Significant Other    Spouse name: Quinton   Number of children: 0   Years of education: Not on file   Highest education level: Not on file  Occupational History   Occupation: assembly line    Comment: Nurse, adult  Tobacco Use   Smoking status: Never   Smokeless tobacco: Never  Vaping Use   Vaping Use: Never used  Substance and Sexual Activity   Alcohol use: Yes    Alcohol/week: 0.0 standard drinks of alcohol    Comment: Rare   Drug use: Never   Sexual activity: Yes    Partners: Male    Birth control/protection: None  Other Topics Concern   Not on file  Social History Narrative   Lives with significant other   Social Determinants of Health   Financial Resource Strain: Not on file  Food Insecurity: Not on file  Transportation Needs: Not on file  Physical Activity: Not on file  Stress: Not on file  Social Connections: Not on file  Intimate Partner Violence: Not on file     Constitutional: Denies fever, malaise, fatigue, headache or abrupt weight changes.  HEENT: Denies eye pain, eye redness, ear pain, ringing in the ears, wax buildup, runny nose, nasal congestion, bloody nose, or sore throat.  Respiratory: Denies difficulty breathing, shortness of breath, cough or sputum production.   Cardiovascular: Denies chest pain, chest tightness, palpitations or swelling in the hands or feet.  Gastrointestinal: Pt reports abdominal cramping. Denies abdominal pain, bloating, constipation, diarrhea or blood in the stool.  GU: Denies urgency, frequency, pain with urination, burning sensation, blood in urine, odor or discharge. Musculoskeletal: Denies decrease in range of motion, difficulty with gait, muscle pain or joint pain and swelling.  Skin: Denies redness, rashes, lesions or ulcercations.  Neurological: Denies  dizziness, difficulty with memory, difficulty with speech or problems with balance and coordination.  Psych: Denies anxiety, depression, SI/HI.  No other specific complaints in a complete review of systems (except as listed in HPI above).  Objective:   Physical Exam BP 106/72 (BP Location: Left Arm, Patient Position: Sitting, Cuff Size: Normal)   Pulse 84   Temp (!) 96.9 F (36.1 C) (Temporal)   Ht 5' 2"  (1.575 m)   Wt 166 lb (75.3 kg)   SpO2 99%   BMI 30.36 kg/m   Wt Readings from Last 3 Encounters:  02/21/22 163 lb (73.9 kg)  12/31/21 159 lb (72.1 kg)  10/23/21 190 lb 8 oz (86.4 kg)    General: Appears her stated age, obese,  in NAD. Skin: Warm, dry and intact.  HEENT: Head: normal shape and size; Eyes: sclera white, no icterus, conjunctiva pink, PERRLA and EOMs intact;  Neck:  Neck supple, trachea midline. No masses, lumps or thyromegaly present.  Cardiovascular: Normal rate and rhythm. S1,S2 noted.  No murmur, rubs or gallops noted. No JVD or BLE edema. Pulmonary/Chest: Normal effort and positive vesicular breath sounds. No respiratory distress. No wheezes, rales or ronchi noted.  Abdomen: Normal bowel sounds.  Musculoskeletal: Strength 5/5 BUE/BLE. No difficulty with gait.  Neurological: Alert and oriented. Cranial nerves II-XII grossly intact. Coordination normal.  Psychiatric: Mood and affect normal. Behavior is normal. Judgment and thought content normal.    BMET    Component Value Date/Time   NA 139 10/25/2021 0654   NA 141 12/10/2017 1723   K 3.7 10/25/2021 0654   CL 105 10/25/2021 0654   CO2 24 10/25/2021 0654   GLUCOSE 79 10/25/2021 0654   BUN 10 10/25/2021 0654   BUN 11 12/10/2017 1723   CREATININE 0.78 10/25/2021 0654   CREATININE 0.61 02/12/2021 1147   CALCIUM 8.9 10/25/2021 0654   GFRNONAA >60 10/25/2021 0654   GFRNONAA 123 02/12/2021 1147   GFRAA 142 02/12/2021 1147    Lipid Panel     Component Value Date/Time   CHOL 171 12/28/2019 1620    TRIG 45.0 12/28/2019 1620   HDL 59.40 12/28/2019 1620   CHOLHDL 3 12/28/2019 1620   VLDL 9.0 12/28/2019 1620   LDLCALC 103 (H) 12/28/2019 1620    CBC    Component Value Date/Time   WBC 13.1 (H) 10/25/2021 0554   RBC 3.21 (L) 10/25/2021 0554   HGB 10.5 (L) 10/25/2021 0554   HCT 30.9 (L) 10/25/2021 0554   PLT 239 10/25/2021 0554   MCV 96.3 10/25/2021 0554   MCV 87.4 12/17/2017 1713   MCH 32.7 10/25/2021 0554   MCHC 34.0 10/25/2021 0554   RDW 15.5 10/25/2021 0554   LYMPHSABS 2.2 12/09/2018 1556   MONOABS 0.7 12/09/2018 1556   EOSABS 0.5 12/09/2018 1556   BASOSABS 0.1 12/09/2018 1556    Hgb A1C Lab Results  Component Value Date   HGBA1C 5.4 12/28/2019  Assessment & Plan:   Preventative Health Maintenance:  She declines flu shot today Tetanus UTD Encouraged her to get a COVID-vaccine Pap smear UTD Encouraged her to consume a balanced diet and exercise regimen Advised her to see a dentist annually We will check CBC, c-Met, lipid and A1c today  RTC in 1 year, sooner if needed Webb Silversmith, NP

## 2022-05-28 NOTE — Patient Instructions (Signed)

## 2022-05-28 NOTE — Assessment & Plan Note (Signed)
Encouraged diet and exercise for weight loss ?

## 2022-06-03 LAB — CBC
HCT: 35.9 % (ref 35.0–45.0)
Hemoglobin: 12.1 g/dL (ref 11.7–15.5)
MCH: 32.4 pg (ref 27.0–33.0)
MCHC: 33.7 g/dL (ref 32.0–36.0)
MCV: 96.2 fL (ref 80.0–100.0)
MPV: 9.6 fL (ref 7.5–12.5)
Platelets: 419 10*3/uL — ABNORMAL HIGH (ref 140–400)
RBC: 3.73 10*6/uL — ABNORMAL LOW (ref 3.80–5.10)
RDW: 13.9 % (ref 11.0–15.0)
WBC: 5.6 10*3/uL (ref 3.8–10.8)

## 2022-06-03 LAB — HEPATITIS PANEL, ACUTE
Hep A IgM: NONREACTIVE
Hep B C IgM: NONREACTIVE
Hepatitis B Surface Ag: NONREACTIVE
Hepatitis C Ab: NONREACTIVE

## 2022-06-03 LAB — COMPLETE METABOLIC PANEL WITH GFR
AG Ratio: 1.5 (calc) (ref 1.0–2.5)
ALT: 10 U/L (ref 6–29)
AST: 15 U/L (ref 10–30)
Albumin: 4.4 g/dL (ref 3.6–5.1)
Alkaline phosphatase (APISO): 61 U/L (ref 31–125)
BUN: 12 mg/dL (ref 7–25)
CO2: 28 mmol/L (ref 20–32)
Calcium: 9.6 mg/dL (ref 8.6–10.2)
Chloride: 104 mmol/L (ref 98–110)
Creat: 0.68 mg/dL (ref 0.50–0.97)
Globulin: 2.9 g/dL (calc) (ref 1.9–3.7)
Glucose, Bld: 86 mg/dL (ref 65–99)
Potassium: 4 mmol/L (ref 3.5–5.3)
Sodium: 139 mmol/L (ref 135–146)
Total Bilirubin: 0.3 mg/dL (ref 0.2–1.2)
Total Protein: 7.3 g/dL (ref 6.1–8.1)
eGFR: 119 mL/min/{1.73_m2} (ref >=60)

## 2022-06-03 LAB — LIPID PANEL
Cholesterol: 146 mg/dL (ref <200)
HDL: 55 mg/dL (ref >=50)
LDL Cholesterol (Calc): 69 mg/dL (calc)
Non-HDL Cholesterol (Calc): 91 mg/dL (calc) (ref <130)
Total CHOL/HDL Ratio: 2.7 (calc) (ref <5.0)
Triglycerides: 132 mg/dL (ref <150)

## 2022-06-03 LAB — HEMOGLOBIN A1C
Hgb A1c MFr Bld: 5.6 % of total Hgb (ref <5.7)
Mean Plasma Glucose: 114 mg/dL
eAG (mmol/L): 6.3 mmol/L

## 2022-06-03 LAB — TEST AUTHORIZATION

## 2022-06-06 ENCOUNTER — Ambulatory Visit
Admission: RE | Admit: 2022-06-06 | Discharge: 2022-06-06 | Disposition: A | Discharge status: Home / Self Care | Payer: BC Managed Care – PPO | Attending: Internal Medicine | Admitting: Internal Medicine

## 2022-06-06 ENCOUNTER — Ambulatory Visit: Payer: BC Managed Care – PPO | Admitting: Internal Medicine

## 2022-06-06 ENCOUNTER — Encounter: Payer: Self-pay | Admitting: Internal Medicine

## 2022-06-06 ENCOUNTER — Ambulatory Visit: Payer: Self-pay | Admitting: *Deleted

## 2022-06-06 ENCOUNTER — Ambulatory Visit
Admission: RE | Admit: 2022-06-06 | Discharge: 2022-06-06 | Disposition: A | Discharge status: Home / Self Care | Payer: BC Managed Care – PPO | Source: Ambulatory Visit | Attending: Internal Medicine | Admitting: Internal Medicine

## 2022-06-06 VITALS — BP 128/62 | HR 88 | Temp 96.8°F | Wt 166.0 lb

## 2022-06-06 DIAGNOSIS — K529 Noninfective gastroenteritis and colitis, unspecified: Secondary | ICD-10-CM

## 2022-06-06 DIAGNOSIS — R1031 Right lower quadrant pain: Secondary | ICD-10-CM

## 2022-06-06 DIAGNOSIS — Z87442 Personal history of urinary calculi: Secondary | ICD-10-CM

## 2022-06-06 LAB — POCT URINALYSIS DIPSTICK
Bilirubin, UA: NEGATIVE
Blood, UA: NEGATIVE
Glucose, UA: NEGATIVE
Ketones, UA: NEGATIVE
Nitrite, UA: NEGATIVE
Protein, UA: POSITIVE — AB
Spec Grav, UA: 1.015 (ref 1.010–1.025)
Urobilinogen, UA: 0.2 E.U./dL
pH, UA: 7 (ref 5.0–8.0)

## 2022-06-06 LAB — POCT URINE PREGNANCY: Preg Test, Ur: NEGATIVE

## 2022-06-06 MED ORDER — KETOROLAC TROMETHAMINE 30 MG/ML IJ SOLN
30.0000 mg | Freq: Once | INTRAMUSCULAR | Status: AC
  Administered 2022-06-06: 30 mg via INTRAMUSCULAR

## 2022-06-06 NOTE — Addendum Note (Signed)
Addended by: Ashley Royalty E on: 06/06/2022 11:14 AM   Modules accepted: Orders

## 2022-06-06 NOTE — Progress Notes (Signed)
Subjective:    Patient ID: Gwendolyn Rice, female    DOB: 03/29/1991, 31 y.o.   MRN: [id number]  HPI  Patient presents to clinic today with complaint of right-sided abdominal pain. She noticed this last night before she ate. She described the pain as sharp and stabbing. The pain is intermittent. She denies nausea, vomiting, reflux, constipation or blood in her stool. She denies urinary urgency, frequency, dysuria, blood in her urine or vaginal symptoms. She denies fever, chills or body aches. She has tried Tylenol OTC with some relief of symptoms. 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 She takes Pantoprazole and Bentyl only as needed. She has had a history of kidney stones.   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     Cetirizine HCl (ZYRTEC PO) Take 1 tablet by mouth.     dicyclomine (BENTYL) 10 MG capsule TAKE 1 CAPSULE (10 MG TOTAL) BY MOUTH 4 TIMES A DAY BEFORE MEALS AND AT BEDTIME 90 capsule 2   omeprazole (PRILOSEC) 20 MG capsule TAKE 1 CAPSULE BY MOUTH EVERY DAY 90 capsule 0   No current facility-administered medications for this visit.    Allergies  Allergen Reactions   Amoxicillin Swelling   Ciprofloxacin Swelling   Penicillins Other (See Comments)    Unknown reaction. as patient had a PCN reaction causing immediate rash, facial/tongue/throat swelling, SOB or lightheadedness with hypotension: unknown Has patient had a PCN reaction causing severe rash involving mucus membranes or skin necrosis: unknown Has patient  had a PCN reaction that required hospitalization: no Has patient had a PCN reaction occurring within the last 10 years: yes If all of the above answers are "NO", then may proceed with Cephalosporin use.    Sulfa Antibiotics Swelling   Flagyl [Metronidazole] Nausea And Vomiting   Macrobid [Nitrofurantoin Monohyd Macro]     States she gets very sick    Family History  Problem Relation Age of Onset   Heart attack Maternal Grandmother    Cancer Paternal Aunt    Colon cancer Neg Hx    Breast cancer Neg Hx     Social History   Socioeconomic History   Marital status: Significant Other    Spouse name: Quinton   Number of children: 0   Years of education: Not on file   Highest education level: Not on file  Occupational History   Occupation: assembly line    Comment: Nurse, adult  Tobacco Use   Smoking status: Never   Smokeless tobacco: Never  Vaping Use   Vaping Use: Never used  Substance and Sexual Activity   Alcohol use: Yes    Alcohol/week: 0.0 standard drinks of alcohol    Comment: Rare   Drug use: Never   Sexual activity: Yes    Partners: Male    Birth control/protection: None  Other Topics Concern   Not on file  Social History Narrative   Lives with significant other   Social Determinants of Health   Financial  Resource Strain: Not on file  Food Insecurity: Not on file  Transportation Needs: Not on file  Physical Activity: Not on file  Stress: Not on file  Social Connections: Not on file  Intimate Partner Violence: Not on file     Constitutional: Denies fever, malaise, fatigue, headache or abrupt weight changes.  Respiratory: Denies difficulty breathing, shortness of breath, cough or sputum production.   Cardiovascular: Denies chest pain, chest tightness, palpitations or swelling in the hands or feet.  Gastrointestinal: Patient reports RUQ abdominal pain, intermittent diarrhea.  Denies abdominal pain, bloating, constipation, or blood in the stool.   GU: Denies urgency, frequency, pain with urination, burning sensation, blood in urine, odor or discharge. Musculoskeletal: Denies decrease in range of motion, difficulty with gait, muscle pain or joint pain and swelling.  Skin: Denies redness, rashes, lesions or ulcercations.   No other specific complaints in a complete review of systems (except as listed in HPI above).     Objective:   Physical Exam  BP 128/62 (BP Location: Right Arm, Patient Position: Sitting, Cuff Size: Normal)   Pulse 88   Temp (!) 96.8 F (36 C) (Temporal)   Wt 166 lb (75.3 kg)   SpO2 99%   BMI 30.36 kg/m   Wt Readings from Last 3 Encounters:  05/28/22 166 lb (75.3 kg)  02/21/22 163 lb (73.9 kg)  12/31/21 159 lb (72.1 kg)    General: Appears her stated age, obese skin in NAD. Skin: Warm, dry and intact. No rashesnoted. HEENT: Head: normal shape and size; Eyes: sclera white, no icterus, conjunctiva pink, PERRLA and EOMs intact; Cardiovascular: Normal rate and rhythm. S1,S2 noted.  No murmur, rubs or gallops noted.  Pulmonary/Chest: Normal effort and positive vesicular breath sounds. No respiratory distress. No wheezes, rales or ronchi noted.  Abdomen: Soft and tender in bilateral lower quadrants, no rebound tenderness. Normal bowel sounds. No distention or masses noted.  Musculoskeletal: No difficulty with gait.  Neurological: Alert and oriented.    BMET    Component Value Date/Time   NA 139 05/28/2022 1558   NA 141 12/10/2017 1723   K 4.0 05/28/2022 1558   CL 104 05/28/2022 1558   CO2 28 05/28/2022 1558   GLUCOSE 86 05/28/2022 1558   BUN 12 05/28/2022 1558   BUN 11 12/10/2017 1723   CREATININE 0.68 05/28/2022 1558   CALCIUM 9.6 05/28/2022 1558   GFRNONAA >60 10/25/2021 0654   GFRNONAA 123 02/12/2021 1147   GFRAA 142 02/12/2021 1147    Lipid Panel     Component Value Date/Time   CHOL 146 05/28/2022 1558   TRIG 132 05/28/2022 1558   HDL 55 05/28/2022 1558   CHOLHDL 2.7 05/28/2022 1558    VLDL 9.0 12/28/2019 1620   LDLCALC 69 05/28/2022 1558    CBC    Component Value Date/Time   WBC 5.6 05/28/2022 1558   RBC 3.73 (L) 05/28/2022 1558   HGB 12.1 05/28/2022 1558   HCT 35.9 05/28/2022 1558   PLT 419 (H) 05/28/2022 1558   MCV 96.2 05/28/2022 1558   MCV 87.4 12/17/2017 1713   MCH 32.4 05/28/2022 1558   MCHC 33.7 05/28/2022 1558   RDW 13.9 05/28/2022 1558   LYMPHSABS 2.2 12/09/2018 1556   MONOABS 0.7 12/09/2018 1556   EOSABS 0.5 12/09/2018 1556   BASOSABS 0.1 12/09/2018 1556    Hgb A1C Lab Results  Component Value Date   HGBA1C 5.6 05/28/2022           Assessment &  Plan:   RLQ Abdominal Pain, History of Kidney Stones, History of Colitis:  Urine pregnancy: Negative Urinalysis: 2+ leuks We will send urine culture KUB ordered for further evaluation Toradol 30 mg IM x1  We will follow-up after labs and imaging with further recommendation and treatment plan.  RTC in 6 months, follow-up chronic conditions Webb Silversmith, NP

## 2022-06-06 NOTE — Telephone Encounter (Signed)
  Chief Complaint: Right lower abd started last night Symptoms: above Frequency: Started last night Pertinent Negatives: Patient denies diarrhea or vomiting Disposition: [] ED /[] Urgent Care (no appt availability in office) / [x] Appointment(In office/virtual)/ []  [place] [facility]/ [] Home Care/ [] Refused Recommended Disposition /[] [place] [facility]/ []  Follow-up with PCP Additional Notes: Appt. Made for today at 10:00 with Webb Silversmith, NP

## 2022-06-06 NOTE — Patient Instructions (Signed)
Abdominal Pain, Adult Many things can cause belly (abdominal) pain. Most times, belly pain is not dangerous. Many cases of belly pain can be watched and treated at home. Sometimes, though, belly pain is serious. Your doctor will try to find the cause of your belly pain. Follow these instructions at home:  Medicines Take over-the-counter and prescription medicines only as told by your doctor. Do not take medicines that help you poop (laxatives) unless told by your doctor. General instructions Watch your belly pain for any changes. Drink enough fluid to keep your pee (urine) pale yellow. Keep all follow-up visits as told by your doctor. This is important. Contact a doctor if: Your belly pain changes or gets worse. You are not hungry, or you lose weight without trying. You are having trouble pooping (constipated) or have watery poop (diarrhea) for more than 2-3 days. You have pain when you pee or poop. Your belly pain wakes you up at night. Your pain gets worse with meals, after eating, or with certain foods. You are vomiting and cannot keep anything down. You have a fever. You have blood in your pee. Get help right away if: Your pain does not go away as soon as your doctor says it should. You cannot stop vomiting. Your pain is only in areas of your belly, such as the right side or the left lower part of the belly. You have bloody or black poop, or poop that looks like tar. You have very bad pain, cramping, or bloating in your belly. You have signs of not having enough fluid or water in your body (dehydration), such as: Dark pee, very little pee, or no pee. Cracked lips. Dry mouth. Sunken eyes. Sleepiness. Weakness. You have trouble breathing or chest pain. Summary Many cases of belly pain can be watched and treated at home. Watch your belly pain for any changes. Take over-the-counter and prescription medicines only as told by your doctor. Contact a doctor if your belly pain  changes or gets worse. Get help right away if you have very bad pain, cramping, or bloating in your belly. This information is not intended to replace advice given to you by your health care provider. Make sure you discuss any questions you have with your health care provider. Document Revised: 12/14/2018 Document Reviewed: 12/14/2018 Elsevier Patient Education  [place].

## 2022-06-06 NOTE — Telephone Encounter (Signed)
Reason for Disposition  [1] MODERATE pain (e.g., interferes with normal activities) AND [2] pain comes and goes (cramps) AND [3] present > 24 hours  (Exception: Pain with Vomiting or Diarrhea - see that Guideline.)  [1] MILD-MODERATE pain AND [2] constant AND [3] present > 2 hours  Answer Assessment - Initial Assessment Questions 1. LOCATION: "Where does it hurt?"      Pain right lower abd. 2. RADIATION: "Does the pain shoot anywhere else?" (e.g., chest, back)     Into my ribs at times. My right side near my appendix. 3. ONSET: "When did the pain begin?" (e.g., minutes, hours or days ago)      Last night  4. SUDDEN: "Gradual or sudden onset?"     Comes and goes 5. PATTERN "Does the pain come and go, or is it constant?"    - If it comes and goes: "How long does it last?" "Do you have pain now?"     (Note: Comes and goes means the pain is intermittent. It goes away completely between bouts.)    - If constant: "Is it getting better, staying the same, or getting worse?"      (Note: Constant means the pain never goes away completely; most serious pain is constant and gets worse.)      Intermittent 6. SEVERITY: "How bad is the pain?"  (e.g., Scale 1-10; mild, moderate, or severe)    - MILD (1-3): Doesn't interfere with normal activities, abdomen soft and not tender to touch.     - MODERATE (4-7): Interferes with normal activities or awakens from sleep, abdomen tender to touch.     - SEVERE (8-10): Excruciating pain, doubled over, unable to do any normal activities.       10/10 at it's worst.   Right now I'm ok 7. RECURRENT SYMPTOM: "Have you ever had this type of stomach pain before?" If Yes, ask: "When was the last time?" and "What happened that time?"      No 8. CAUSE: "What do you think is causing the stomach pain?"     Not asked 9. RELIEVING/AGGRAVATING FACTORS: "What makes it better or worse?" (e.g., antacids, bending or twisting motion, bowel movement)     Not asked 10. OTHER SYMPTOMS:  "Do you have any other symptoms?" (e.g., back pain, diarrhea, fever, urination pain, vomiting)       No diarrhea or vomiting 11. PREGNANCY: "Is there any chance you are pregnant?" "When was your last menstrual period?"       No  Protocols used: Abdominal Pain - [facility]

## 2022-06-07 ENCOUNTER — Telehealth: Payer: Self-pay

## 2022-06-07 NOTE — Telephone Encounter (Signed)
Pt given lab and CT Abdomen results per notes of Webb Silversmith, NP on 06/07/22. Pt verbalized understanding. She says her abdominal pain is there and wavers back and forth. She says the medicine received in the office yesterday, Toradol, really helped the pain. Advised I will send this to [facility] and someone will call with her recommendation on what to do for the pain. Advised if worsens to go to the ED, she verbalized understanding.      You had some white blood cells in your urine.  I am sending this for a culture for further evaluation to rule out a urinary tract infection.  You are not pregnant.  Written by Jearld [place], NP on 06/06/2022 11:23 AM EDT   CT scan did not show any identifiable cause of your RLQ pain. How are you feeling today?  Written by Jearld F[place], NP on 06/07/2022 10:13 AM EDT

## 2022-06-08 LAB — URINE CULTURE
MICRO NUMBER:: 14074006
Result:: NO GROWTH
SPECIMEN QUALITY:: ADEQUATE

## 2022-06-10 ENCOUNTER — Ambulatory Visit: Payer: Self-pay | Admitting: *Deleted

## 2022-06-10 ENCOUNTER — Telehealth: Payer: Self-pay

## 2022-06-10 ENCOUNTER — Ambulatory Visit (INDEPENDENT_AMBULATORY_CARE_PROVIDER_SITE_OTHER): Payer: BC Managed Care – PPO | Admitting: Physician Assistant

## 2022-06-10 ENCOUNTER — Encounter: Payer: Self-pay | Admitting: Physician Assistant

## 2022-06-10 VITALS — BP 124/72 | HR 93 | Temp 96.9°F | Wt 164.0 lb

## 2022-06-10 DIAGNOSIS — R1031 Right lower quadrant pain: Secondary | ICD-10-CM | POA: Diagnosis not present

## 2022-06-10 NOTE — Assessment & Plan Note (Addendum)
Acute, ongoing concern Reports this has been going on since Wed  Previously tested with UA and urine culture- UTI ruled out and KUB did not demonstrate stones Given PE -I am mildly concerned for appendicitis today, discussed this with patient but she is hesitant to get CT scan due to cost. Will place order for [place] of appendix to assist with rule out, pt is afebrile and does not appear toxic which is reassuring Reviewed that there are other potential causes for pain: ovarian cyst, mesenteric lymphadenopathy, colitis She has an apt with GI on Friday- encouraged her to keep this and make [place] apt at earliest convenience Reviewed ED and return precautions Follow up as needed.

## 2022-06-10 NOTE — Telephone Encounter (Signed)
Urine culture did not grow any evidence of UTI. This is not likely kidney stone. Could be related to her colitis. Would recommend she take Bentyl as prescribed along with Aleve 220 mg BID if symptoms persist. She should follow up with GI if symptoms worsen

## 2022-06-10 NOTE — Telephone Encounter (Signed)
  Chief Complaint: abdominal pain Symptoms: lower R abdominal pain Frequency: started Wednesday night- on/off Pertinent Negatives: Patient denies fever back pain, vomiting Disposition: [] ED /[] Urgent Care (no appt availability in office) / [x] Appointment(In office/virtual)/ []  [place] [facility]/ [] Home Care/ [] Refused Recommended Disposition /[] [place] [facility]/ []  Follow-up with PCP Additional Notes: Patient is requesting extension of out of work letter.

## 2022-06-10 NOTE — Telephone Encounter (Signed)
Reason for Disposition  [1] MILD pain (e.g., does not interfere with normal activities) AND [2] pain comes and goes (cramps) AND [3] present > 48 hours  (Exception: This same abdominal pain is a chronic symptom recurrent or ongoing AND present > 4 weeks.)  Answer Assessment - Initial Assessment Questions 1. LOCATION: "Where does it hurt?"      R side- lower 2. RADIATION: "Does the pain shoot anywhere else?" (e.g., chest, back)     no 3. ONSET: "When did the pain begin?" (e.g., minutes, hours or days ago)      Wednesday night 4. SUDDEN: "Gradual or sudden onset?"     Sudden 5. PATTERN "Does the pain come and go, or is it constant?"    - If it comes and goes: "How long does it last?" "Do you have pain now?"     (Note: Comes and goes means the pain is intermittent. It goes away completely between bouts.)    - If constant: "Is it getting better, staying the same, or getting worse?"      (Note: Constant means the pain never goes away completely; most serious pain is constant and gets worse.)      Comes and goes- can last couple minutes or longer 6. SEVERITY: "How bad is the pain?"  (e.g., Scale 1-10; mild, moderate, or severe)    - MILD (1-3): Doesn't interfere with normal activities, abdomen soft and not tender to touch.     - MODERATE (4-7): Interferes with normal activities or awakens from sleep, abdomen tender to touch.     - SEVERE (8-10): Excruciating pain, doubled over, unable to do any normal activities.       mild 7. RECURRENT SYMPTOM: "Have you ever had this type of stomach pain before?" If Yes, ask: "When was the last time?" and "What happened that time?"      no 8. CAUSE: "What do you think is causing the stomach pain?"     Not sure 9. RELIEVING/AGGRAVATING FACTORS: "What makes it better or worse?" (e.g., antacids, bending or twisting motion, bowel movement)     relaxing 10. OTHER SYMPTOMS: "Do you have any other symptoms?" (e.g., back pain, diarrhea, fever, urination pain,  vomiting)       Headache today and yesterday 11. PREGNANCY: "Is there any chance you are pregnant?" "When was your last menstrual period?"       No  Protocols used: Abdominal Pain - [facility]

## 2022-06-10 NOTE — Telephone Encounter (Signed)
Pt has appointment today (06/10/2022) with Erin.    Thanks,   -Mickel Baas

## 2022-06-10 NOTE — Patient Instructions (Addendum)
Please go to the ED if your pain gets worse and is accompanied by fever, chills, lightheadedness or dizziness  Your pain could be from one of the following: appendicitis (inflammation and infection of the appendix, inflammation of an abdominal lymph node, an ovarian cyst, or your colitis)

## 2022-06-10 NOTE — Telephone Encounter (Signed)
Copied from [place] [id number]. Topic: General - Other >> Jun 10, 2022  2:22 PM Leilani Able wrote: Reason for CRM: [facility] called stating that Gwendolyn Rice had ordered an ultra sound for pt and they do not this test, they would do a CT scan. Please FU to advise. Pt. [facility] # [phone]

## 2022-06-10 NOTE — Progress Notes (Signed)
Acute Office Visit   Patient: Gwendolyn Rice   DOB: August 14, 1991   31 y.o. Female  MRN: [id number] Visit Date: 06/10/2022  Today's healthcare provider: Dani Gobble Hilarie Sinha, PA-C  Introduced myself to the patient as a Journalist, newspaper and provided education on APPs in clinical practice.    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   Cetirizine HCl (ZYRTEC PO) Take 1 tablet by mouth.   dicyclomine (BENTYL) 10 MG capsule TAKE 1 CAPSULE (10 MG TOTAL) BY MOUTH 4 TIMES A DAY BEFORE MEALS AND AT BEDTIME   omeprazole (PRILOSEC) 20 MG capsule TAKE 1 CAPSULE BY MOUTH EVERY DAY   No facility-administered medications prior to visit.    Review of Systems  Constitutional:  Negative for chills, fatigue and fever.  Gastrointestinal:  Positive for abdominal pain and nausea. Negative for blood in stool, constipation, diarrhea and vomiting.  Genitourinary:  Negative for difficulty urinating, dysuria, flank pain, hematuria, urgency and vaginal pain.       Objective    BP 124/72 (BP Location: Left Arm, Patient Position: Sitting, Cuff Size: Normal)   Pulse 93   Temp (!) 96.9 F (36.1 C) (Temporal)   Wt 164 lb (74.4 kg)   LMP 05/27/2022 (Approximate) Comment: Negative  preg. test 06/06/2022  SpO2 99%   BMI 30.00 kg/m    Physical Exam Vitals reviewed.  Constitutional:      General: She is awake.     Appearance: Normal appearance. She is well-developed and well-groomed.  HENT:     Head: Normocephalic and atraumatic.  Cardiovascular:     Rate and Rhythm: Normal rate and regular rhythm.     Pulses: Normal pulses.          Radial pulses are 2+ on the right side and 2+ on the left side.     Heart sounds: Normal heart sounds. No murmur heard.    No friction rub. No gallop.  Pulmonary:     Effort: Pulmonary effort is normal.     Breath sounds: Normal breath sounds. No decreased air movement. No decreased breath sounds, wheezing, rhonchi or rales.  Abdominal:     General: Abdomen is flat. Bowel sounds are normal.     Palpations: Abdomen is soft.     Tenderness: There is abdominal tenderness in the right lower quadrant. There is rebound. There is no guarding. Positive signs include Rovsing's sign and McBurney's sign. Negative signs include psoas sign and obturator sign.  Musculoskeletal:     Right lower leg: No edema.     Left lower leg: No edema.  Neurological:     Mental Status: She is alert.  Psychiatric:  Behavior: Behavior is cooperative.       No results found for any visits on 06/10/22.  Assessment & Plan      No follow-ups on file.        Problem List Items Addressed This Visit       Other   Abdominal pain, RLQ - Primary    Acute, ongoing concern Reports this has been going on since Wed  Previously tested with UA and urine culture- UTI ruled out and KUB did not demonstrate stones Given PE -I am mildly concerned for appendicitis today, discussed this with patient but she is hesitant to get CT scan due to cost. Will place order for [place] of appendix to assist with rule out, pt is afebrile and does not appear toxic which is reassuring Reviewed that there are other potential causes for pain: ovarian cyst, mesenteric  lymphadenopathy, colitis She has an apt with GI on Friday- encouraged her to keep this and make [place] apt at earliest convenience Reviewed ED and return precautions Follow up as needed.      Relevant Orders   US APPENDIX (ABDOMEN LIMITED)     No follow-ups on file.   I, Azara Gemme E Tee Richeson, PA-C, have reviewed all documentation for this visit. The documentation on 06/10/22 for the exam, diagnosis, procedures, and orders are all accurate and complete.   Talitha Givens, MHS, PA-C [facility]

## 2022-06-11 NOTE — Telephone Encounter (Signed)
Are you able to follow up on this or do you need me to? Thx for seeing her!

## 2022-06-11 NOTE — Addendum Note (Signed)
Addended by: Talitha Givens on: 06/11/2022 08:32 AM   Modules accepted: Orders

## 2022-06-12 ENCOUNTER — Other Ambulatory Visit: Payer: BC Managed Care – PPO

## 2022-06-14 DIAGNOSIS — R1030 Lower abdominal pain, unspecified: Secondary | ICD-10-CM | POA: Diagnosis not present

## 2022-06-14 DIAGNOSIS — K5100 Ulcerative (chronic) pancolitis without complications: Secondary | ICD-10-CM | POA: Diagnosis not present

## 2022-07-04 ENCOUNTER — Encounter: Payer: Self-pay | Admitting: Gastroenterology

## 2022-07-04 ENCOUNTER — Ambulatory Visit (INDEPENDENT_AMBULATORY_CARE_PROVIDER_SITE_OTHER): Payer: BC Managed Care – PPO | Admitting: Gastroenterology

## 2022-07-04 VITALS — BP 121/83 | HR 87 | Temp 98.7°F | Ht 62.0 in | Wt 167.0 lb

## 2022-07-04 DIAGNOSIS — R1031 Right lower quadrant pain: Secondary | ICD-10-CM

## 2022-07-04 DIAGNOSIS — G8929 Other chronic pain: Secondary | ICD-10-CM

## 2022-07-04 NOTE — Progress Notes (Signed)
Jonathon Bellows MD, MRCP(U.K) [address]  [place]  [place], [place] [address]  Main: [phone]  Fax: [phone]   Gastroenterology Consultation  Referring Provider:     Jearld Fenton, NP Primary Care Physician:  Jearld Fenton, NP Primary Gastroenterologist:  Dr. Jonathon Bellows  Reason for Consultation:   Colitis        HPI:   Gwendolyn Rice is a 31 y.o. y/o female referred for consultation & management  by  Jearld Fenton, NP.    She was referred back in July 2023 for abdominal cramps and colitis.  In October 2023 x-ray of the abdomen showed no gross abnormalities  She was seen by her physician assistant in October 2023 for right lower quadrant pain a few days in duration.  She is here today to see me for right lower quadrant pain ongoing for about 5 years.  Occurs on a daily basis lasting for minutes to hours cannot really describe the pain localized nonradiating no clear aggravating factors except movement and relieving factors as rest unrelated to food intake or bowel movements.  Gained weight recently after having a normal vaginal delivery.  Has normal bowel movements denies any diarrhea or rectal bleeding.  Denies any NSAID use.  She states she has had evaluation some years back by [facility] gastro[facility]ology and was told she had colitis.  I cannot have find any records in epic about her previous evaluation.   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 Ectocervical and endocervical margins positive for LGSIL    Prior to Admission medications   Medication Sig Start Date End Date Taking? Authorizing  Provider  azaTHIOprine (IMURAN) 50 MG tablet Take 150 mg by mouth daily. 04/11/22   [provider]  Cetirizine HCl (ZYRTEC PO) Take 1 tablet by mouth.    [provider]  dicyclomine (BENTYL) 10 MG capsule TAKE 1 CAPSULE (10 MG TOTAL) BY MOUTH 4 TIMES A DAY BEFORE MEALS AND AT BEDTIME 03/18/22   Jearld Fenton, NP  omeprazole (PRILOSEC) 20 MG capsule TAKE 1 CAPSULE BY MOUTH EVERY DAY 05/03/22   Jearld Fenton, NP    Family History  Problem Relation Age of Onset   Heart attack Maternal Grandmother    Cancer Paternal Aunt    Colon cancer Neg Hx    Breast cancer Neg Hx      Social History   Tobacco Use   Smoking status: Never   Smokeless tobacco: Never  Vaping Use   Vaping Use: Never used  Substance Use Topics   Alcohol use: Yes    Alcohol/week: 0.0 standard drinks of alcohol    Comment: Rare   Drug use: Never    Allergies as of 07/04/2022 - Review Complete 07/04/2022  Allergen Reaction Noted   Amoxicillin Swelling 07/10/2015   Ciprofloxacin Swelling 07/10/2015   Penicillins Other (See Comments) 07/12/2013   Sulfa antibiotics Swelling 07/12/2013   Flagyl [metronidazole] Nausea And Vomiting 06/09/2017   Macrobid [nitrofurantoin monohyd macro]  10/16/2016    Review of Systems:    All systems reviewed and negative except where noted in HPI.  Physical Exam:  BP 121/83   Pulse 87   Temp 98.7 F (37.1 C)   Ht 5' 2"  (1.575 m)   Wt 167 lb (75.8 kg)   LMP 05/27/2022 (Approximate) Comment: Negative preg. test 06/06/2022  BMI 30.54 kg/m  Patient's last menstrual period was 05/27/2022 (approximate). Psych:  Alert and cooperative. Normal mood and affect. General:   Alert,  Well-developed, well-nourished, pleasant and cooperative in NAD Head:  Normocephalic and atraumatic. Eyes:  Sclera clear, no icterus.   Conjunctiva pink. Ears:  Normal auditory acuity. Neck:  Supple; no masses or thyromegaly. Lungs:  Respirations even and unlabored.  Clear throughout to  auscultation.   No wheezes, crackles, or rhonchi. No acute distress. Heart:  Regular rate and rhythm; no murmurs, clicks, rubs, or gallops. Abdomen:  Normal bowel sounds.  No bruits.  Soft, tenderness over the right iliac crest and in the right inguinal area as the area about the right inguinal area.  And non-distended without masses, hepatosplenomegaly or hernias noted.  No guarding or rebound tenderness.    Neurologic:  Alert and oriented x3;  grossly normal neurologically. Psych:  Alert and cooperative. Normal mood and affect.  Imaging Studies: DG Abd 1 View  Result Date: 06/06/2022 CLINICAL DATA:  Right lower quadrant pain. EXAM: ABDOMEN - 1 VIEW COMPARISON:  CT scan Dec 24, 2017 FINDINGS: No renal stones identified. Two calcifications in the left pelvis are consistent with phleboliths identified on the comparison CT scan. A calcification in the right side of the pelvis was not visualized on the comparison CT scan. However, there is suggestion of a lucent center. No other bony or soft tissue abnormalities are identified. IMPRESSION: 1. No renal stones identified. 2. A calcification in the right side of the pelvis was not visualized on the comparison CT scan. However, there is suggestion of a lucent center suggesting the possibility of a phlebolith. Given the right lower quadrant pain and history of stones, a CT scan could further evaluate this calcification given the absence of a phlebolith in this region on previous CT imaging. 3. No other abnormalities. Electronically Signed   By: Dorise Bullion III M.D.   On: 06/06/2022 17:18    Assessment and Plan:   Gwendolyn Rice is a 32 y.o. y/o female has been referred for right lower quadrant pain ongoing for 5 years, recent weight gain after pregnancy, normal bowel movements no rectal bleeding.  She says she has a history of colitis and has been worked up at [facility] gastro[facility]ology some years back I could not find any records.  I do not believe at  this point of time any of her symptoms are related to any form of colitis as she has no diarrhea or rectal bleeding or frequency.  On examination she is tender over the right iliac crest in the inguinal area which is very likely musculoskeletal.  I explained to her that I would first like to get all her old records following which I will discuss our options which could include imaging.  Follow up in 1 to 2 weeks.  Dr Jonathon Bellows MD,MRCP(U.K)

## 2022-07-22 ENCOUNTER — Encounter: Payer: Self-pay | Admitting: *Deleted

## 2022-07-22 NOTE — Telephone Encounter (Signed)
Error, waiting on records from [place]

## 2022-07-31 ENCOUNTER — Other Ambulatory Visit: Payer: Self-pay | Admitting: Internal Medicine

## 2022-08-01 DIAGNOSIS — B3731 Acute candidiasis of vulva and vagina: Secondary | ICD-10-CM | POA: Diagnosis not present

## 2022-08-01 DIAGNOSIS — N393 Stress incontinence (female) (male): Secondary | ICD-10-CM | POA: Diagnosis not present

## 2022-08-30 DIAGNOSIS — Z6831 Body mass index (BMI) 31.0-31.9, adult: Secondary | ICD-10-CM | POA: Diagnosis not present

## 2022-08-30 DIAGNOSIS — R1030 Lower abdominal pain, unspecified: Secondary | ICD-10-CM | POA: Diagnosis not present

## 2022-09-25 DIAGNOSIS — R61 Generalized hyperhidrosis: Secondary | ICD-10-CM | POA: Diagnosis not present

## 2022-10-07 ENCOUNTER — Encounter: Payer: Self-pay | Admitting: Physician Assistant

## 2022-10-07 ENCOUNTER — Ambulatory Visit: Payer: BC Managed Care – PPO | Admitting: Physician Assistant

## 2022-10-07 VITALS — BP 110/80 | HR 79 | Temp 96.6°F | Ht 62.0 in | Wt 172.0 lb

## 2022-10-07 DIAGNOSIS — J069 Acute upper respiratory infection, unspecified: Secondary | ICD-10-CM | POA: Diagnosis not present

## 2022-10-07 MED ORDER — FLUTICASONE PROPIONATE 50 MCG/ACT NA SUSP: 2.0000 | Freq: Every day | NASAL | 1 refills | Status: DC

## 2022-10-07 NOTE — Progress Notes (Signed)
Acute Office Visit   Patient: Gwendolyn Rice   DOB: [birth year]   32 y.o. Female  MRN: [id number] Visit Date: 10/07/2022  Today's healthcare provider: Dani Gobble Evalin Shawhan, PA-C  Introduced myself to the patient as a Journalist, newspaper and provided education on APPs in clinical practice.    Chief Complaint  Patient presents with   Sinusitis   Subjective    Sinusitis Associated symptoms include congestion, coughing, ear pain, headaches, shortness of breath, sinus pressure and a sore throat. Pertinent negatives include no chills.     URI-type symptoms Onset: sudden  Duration: ongoing since Sat 10/05/22 Associated symptoms: sinus pain and pressure, ear pain , sinus congestion, sore throat, mild SOB  She has not checked her temp at home  Interventions: Flonase, equate cold and flu pills   Recent sick contacts: she reports several work associates have been sick recently  COVID testing at home: has not tested for COVID at home   Results: NA   Medications: Outpatient Medications Prior to Visit  Medication Sig   azaTHIOprine (IMURAN) 50 MG tablet Take 150 mg by mouth daily.   Cetirizine HCl (ZYRTEC PO) Take 1 tablet by mouth.   omeprazole (PRILOSEC) 20 MG capsule TAKE 1 CAPSULE BY MOUTH EVERY DAY   dicyclomine (BENTYL) 10 MG capsule TAKE 1 CAPSULE (10 MG TOTAL) BY MOUTH 4 TIMES A DAY BEFORE MEALS AND AT BEDTIME (Patient not taking: Reported on 07/04/2022)   No facility-administered medications prior to visit.    Review of Systems  Constitutional:  Negative for chills, fatigue and fever.  HENT:  Positive for congestion, ear pain, postnasal drip, sinus pressure, sinus pain and sore throat.   Respiratory:  Positive for cough and shortness of breath.   Gastrointestinal:  Negative for diarrhea, nausea and vomiting.  Musculoskeletal:  Negative for myalgias.  Neurological:  Positive for headaches. Negative for dizziness and light-headedness.       Objective    BP 110/80   Pulse 79    Temp (!) 96.6 F (35.9 C)   Ht 5' 2"$  (1.575 m)   Wt 172 lb (78 kg)   SpO2 99%   BMI 31.46 kg/m    Physical Exam Vitals reviewed.  Constitutional:      General: She is awake.     Appearance: Normal appearance. She is well-developed and well-groomed.  HENT:     Head: Normocephalic and atraumatic.     Right Ear: Hearing and ear canal normal. There is impacted cerumen.     Left Ear: Hearing and ear canal normal. There is impacted cerumen.     Mouth/Throat:     Lips: Pink.     Mouth: Mucous membranes are moist. No lacerations or oral lesions.     Pharynx: Oropharynx is clear. Uvula midline. No pharyngeal swelling, oropharyngeal exudate, posterior oropharyngeal erythema or uvula swelling.  Cardiovascular:     Rate and Rhythm: Normal rate and regular rhythm.     Pulses: Normal pulses.          Radial pulses are 2+ on the right side and 2+ on the left side.     Heart sounds: Normal heart sounds. No murmur heard.    No friction rub. No gallop.  Pulmonary:     Effort: Pulmonary effort is normal.     Breath sounds: Normal breath sounds. No decreased air movement. No decreased breath sounds, wheezing, rhonchi or rales.  Musculoskeletal:     Cervical back: Normal range  of motion and neck supple.  Lymphadenopathy:     Head:     Right side of head: No submental, submandibular or preauricular adenopathy.     Left side of head: No submental, submandibular or preauricular adenopathy.     Cervical:     Right cervical: No superficial or posterior cervical adenopathy.    Left cervical: No superficial or posterior cervical adenopathy.  Neurological:     Mental Status: She is alert.  Psychiatric:        Behavior: Behavior is cooperative.       No results found for any visits on 10/07/22.  Assessment & Plan      No follow-ups on file.      Problem List Items Addressed This Visit   None Visit Diagnoses     Viral upper respiratory tract infection    -  Primary Acute, new  concern Visit with patient indicates symptoms comprised of sinus pressure, sinus congestion, mild cough, ear pain since Sat  congruent with acute URI that is likely viral in nature  Patient was negative for Flu A and B, and COVID negative per in office testing - results reviewed with her during apt. Due to nature and duration of symptoms recommended treatment regimen is symptomatic relief and follow up if needed Discussed with patient the various viral and bacterial etiologies of current illness and appropriate course of treatment Discussed OTC medication options for multisymptom relief such as Dayquil/Nyquil, Theraflu, AlkaSeltzer, etc. Discussed return precautions if symptoms are not improving or worsen over next 5-7 days.          No follow-ups on file.   I, Dakoda Laventure E Dannell Raczkowski, PA-C, have reviewed all documentation for this visit. The documentation on 10/07/22 for the exam, diagnosis, procedures, and orders are all accurate and complete.   Talitha Givens, MHS, PA-C [facility]

## 2022-10-07 NOTE — Addendum Note (Signed)
Addended by: Talitha Givens on: 10/07/2022 03:43 PM   Modules accepted: Orders

## 2022-10-07 NOTE — Patient Instructions (Signed)
Based on your described symptoms and the duration of symptoms it is likely that you have a viral upper respiratory infection (often called a "cold")  Symptoms can last for 3-10 days with lingering cough and intermittent symptoms lasting weeks after that.  The goal of treatment at this time is to reduce your symptoms and discomfort    You can use over the counter medications such as Dayquil/Nyquil, AlkaSeltzer formulations, etc to provide further relief of symptoms according to the manufacturer's instructions  If preferred you can use Coricidin to manage your symptoms rather than those medications mentioned above.    If your symptoms do not improve or become worse in the next 5-7 days please make an apt at the office so we can see you  Go to the ER if you begin to have more serious symptoms such as shortness of breath, trouble breathing, loss of consciousness, swelling around the eyes, high fever, severe lasting headaches, vision changes or neck pain/stiffness.

## 2022-10-08 ENCOUNTER — Telehealth: Payer: Self-pay

## 2022-10-08 ENCOUNTER — Encounter: Payer: Self-pay | Admitting: Physician Assistant

## 2022-10-08 NOTE — Telephone Encounter (Signed)
Copied from [place] [id number]. Topic: General - Other >> Oct 08, 2022 11:32 AM Chapman Fitch wrote: Reason for CRM: Pt received a letter out of work yesterday but was not feeling able to go to work today / pt asked if she can get another note for today as well / please advise >> Oct 08, 2022  2:54 PM Teressa P wrote: Pt called back saying she needs the note before the end of the day if possible.

## 2022-10-08 NOTE — Telephone Encounter (Signed)
Requested note is available in MyChart. It states she is clear to return to work tomorrow 10/09/22.

## 2022-10-21 ENCOUNTER — Telehealth: Payer: Self-pay | Admitting: Gastroenterology

## 2022-10-21 NOTE — Telephone Encounter (Signed)
Good Afternoon Dr. Bryan Lemma,  Supervising MD 3/4 PM  We received a referral for this patient from Dr. Brien Mates for abdominal pain, has history of colitis, and has trouble holding her bowel movements. Patient is wishing to transfer care here, has recently seen [facility] this past November but was very unhappy with patient care. She also had a colonoscopy with [facility] in 2019. Records are available in Epic, please review them at your earliest convenience and advise on scheduling.  Thank You!

## 2022-10-22 NOTE — Telephone Encounter (Signed)
Was seen by Dr. Jonathon Bellows on 07/04/2022 who was trying to obtain any previous records for review and recommended follow-up in 1-2 weeks.  Looks like she never scheduled that follow-up.  Additionally, there was a prescription sent in by Dr. Therisa Doyne from [place] on 09/11/2022 for colestipol.  Previous to that, colonoscopy by Dr. Therisa Doyne in 01/2018 notable for moderately atrophic, ulcerated mucosa throughout the colon, with normal TI, with biopsies demonstrating Ulcerative Colitis.  I otherwise do not have any access to [facility] EMR.  Since this patient has been seen by 2 previous gastroenterologist in the area, and has not apparently allowed appropriate chart review by Dr. Vicente Males, I do not see a reason for transfer of care to our facility.  I recommend she either continue care with Dr. Vicente Males or Dr. Therisa Doyne, and recommend against bouncing between multiple GI practices as this will surely disrupt continuity of care.  I do not acccept transfer to [facility].

## 2022-10-23 NOTE — Telephone Encounter (Signed)
Patient advised.

## 2022-10-27 ENCOUNTER — Other Ambulatory Visit: Payer: Self-pay | Admitting: Internal Medicine

## 2022-10-28 NOTE — Telephone Encounter (Signed)
Requested Prescriptions  Pending Prescriptions Disp Refills   omeprazole (PRILOSEC) 20 MG capsule [Pharmacy Med Name: OMEPRAZOLE DR 20 MG CAPSULE] 90 capsule 0    Sig: TAKE 1 CAPSULE BY MOUTH EVERY DAY     Gastroenterology: Proton Pump Inhibitors Passed - 10/27/2022  8:37 AM      Passed - Valid encounter within last 12 months    Recent Outpatient Visits           3 weeks ago Viral upper respiratory tract infection   [place], Dani Gobble, PA-C   4 months ago Abdominal pain, RLQ   [facility], [place], [place]   4 months ago RLQ abdominal pain   [facility] [place], Coralie Keens, NP   5 months ago Encounter for general adult medical examination with abnormal findings   [facility] [place], Coralie Keens, NP   8 months ago Abdominal cramps   [facility] [place], Coralie Keens, [place]

## 2022-10-30 ENCOUNTER — Other Ambulatory Visit: Payer: Self-pay | Admitting: Physician Assistant

## 2022-10-30 DIAGNOSIS — J069 Acute upper respiratory infection, unspecified: Secondary | ICD-10-CM

## 2022-10-30 DIAGNOSIS — R300 Dysuria: Secondary | ICD-10-CM | POA: Diagnosis not present

## 2022-10-31 NOTE — Telephone Encounter (Signed)
Requested medication (s) are due for refill today - no  Requested medication (s) are on the active medication list -yes  Future visit scheduled -no  Last refill: 10/07/22 16g 1RF  Notes to clinic: Rx for acute visit- sent for review to continue and request for 90 days supply  Requested Prescriptions  Pending Prescriptions Disp Refills   fluticasone (FLONASE) 50 MCG/ACT nasal spray [Pharmacy Med Name: FLUTICASONE PROP 50 MCG SPRAY] 48 mL 1    Sig: SPRAY 2 SPRAYS INTO EACH NOSTRIL EVERY DAY     Ear, Nose, and Throat: Nasal Preparations - Corticosteroids Passed - 10/30/2022  1:33 PM      Passed - Valid encounter within last 12 months    Recent Outpatient Visits           3 weeks ago Viral upper respiratory tract infection   [facility], Dani Gobble, PA-C   4 months ago Abdominal pain, RLQ   [facility], [place], [place]   4 months ago RLQ abdominal pain   [facility] [place], Coralie Keens, NP   5 months ago Encounter for general adult medical examination with abnormal findings   [facility] [place], Coralie Keens, NP   8 months ago Abdominal cramps   [facility] [place], [place], [place]                 Requested Prescriptions  Pending Prescriptions Disp Refills   fluticasone (FLONASE) 50 MCG/ACT nasal spray [Pharmacy Med Name: FLUTICASONE PROP 50 MCG SPRAY] 48 mL 1    Sig: SPRAY 2 SPRAYS INTO EACH NOSTRIL EVERY DAY     Ear, Nose, and Throat: Nasal Preparations - Corticosteroids Passed - 10/30/2022  1:33 PM      Passed - Valid encounter within last 12 months    Recent Outpatient Visits           3 weeks ago Viral upper respiratory tract infection   [place], PA-C   4 months ago Abdominal pain, RLQ   [facility], [place], [place]   4 months ago RLQ abdominal pain   [facility] [place], Coralie Keens, NP   5 months ago Encounter for general adult medical examination with abnormal findings   [facility] [place], Coralie Keens, NP   8 months ago Abdominal cramps   [facility] [place], Coralie Keens, [place]

## 2022-12-06 DIAGNOSIS — K51919 Ulcerative colitis, unspecified with unspecified complications: Secondary | ICD-10-CM | POA: Diagnosis not present

## 2022-12-11 ENCOUNTER — Other Ambulatory Visit: Payer: Self-pay | Admitting: Internal Medicine

## 2022-12-11 NOTE — Telephone Encounter (Signed)
Requested Prescriptions  Pending Prescriptions Disp Refills   omeprazole (PRILOSEC) 20 MG capsule [Pharmacy Med Name: OMEPRAZOLE DR 20 MG CAPSULE] 90 capsule 0    Sig: TAKE 1 CAPSULE BY MOUTH EVERY DAY     Gastroenterology: Proton Pump Inhibitors Passed - 12/11/2022 12:55 PM      Passed - Valid encounter within last 12 months    Recent Outpatient Visits           2 months ago Viral upper respiratory tract infection   [place] [facility], Oswaldo [place], PA-C   6 months ago Abdominal pain, RLQ   [place] [facility], [place], [place]   6 months ago RLQ abdominal pain   [place] [facility] [place], Salvadore [place], NP   6 months ago Encounter for general adult medical examination with abnormal findings   [place] [facility] [place], Salvadore [place], NP   9 months ago Abdominal cramps   [place] [facility] [place], Salvadore Oxford, [place]

## 2023-05-14 ENCOUNTER — Other Ambulatory Visit: Payer: Self-pay | Admitting: Internal Medicine

## 2023-05-14 DIAGNOSIS — J069 Acute upper respiratory infection, unspecified: Secondary | ICD-10-CM

## 2023-05-14 NOTE — Telephone Encounter (Signed)
Medication Refill - Medication: fluticasone (FLONASE) 50 MCG/ACT nasal spray [[id number]]   Has the patient contacted their pharmacy? Yes.     (Agent: If yes, when and what did the pharmacy advise?)  Preferred Pharmacy (with phone number or street name): [id number] - [place], [place] - [address]   Has the patient been seen for an appointment in the last year OR does the patient have an upcoming appointment? Yes.    Agent: Please be advised that RX refills may take up to 3 business days. We ask that you follow-up with your pharmacy.

## 2023-05-15 MED ORDER — FLUTICASONE PROPIONATE 50 MCG/ACT NA SUSP
2.0000 | Freq: Every day | NASAL | 1 refills | Status: AC
Start: 2023-05-15 — End: ?

## 2023-05-15 NOTE — Telephone Encounter (Signed)
Requested Prescriptions  Pending Prescriptions Disp Refills   fluticasone (FLONASE) 50 MCG/ACT nasal spray 48 mL 1    Sig: Place 2 sprays into both nostrils daily.     Ear, Nose, and Throat: Nasal Preparations - Corticosteroids Passed - 05/14/2023  4:15 PM      Passed - Valid encounter within last 12 months    Recent Outpatient Visits           7 months ago Viral upper respiratory tract infection   [place] [facility] [place], Oswaldo [place], [place]   11 months ago Abdominal pain, RLQ   [place] [facility] [place], Oswaldo [place], [place]   11 months ago RLQ abdominal pain   [facility] [place], Salvadore [place], [place]   11 months ago Encounter for general adult medical examination with abnormal findings   [place] [facility] [place], Salvadore [place], NP   1 year ago Abdominal cramps   [place] [facility] [place], Salvadore [place], [place]

## 2023-06-22 IMAGING — DX DG HAND COMPLETE 3+V*L*
3 series · 3 of 3 positions shown · non-contrast
Comparison: None Available.

CLINICAL DATA: Left hand pain for 5 years.

EXAM:
LEFT HAND - COMPLETE 3+ VIEW

[hand ap]
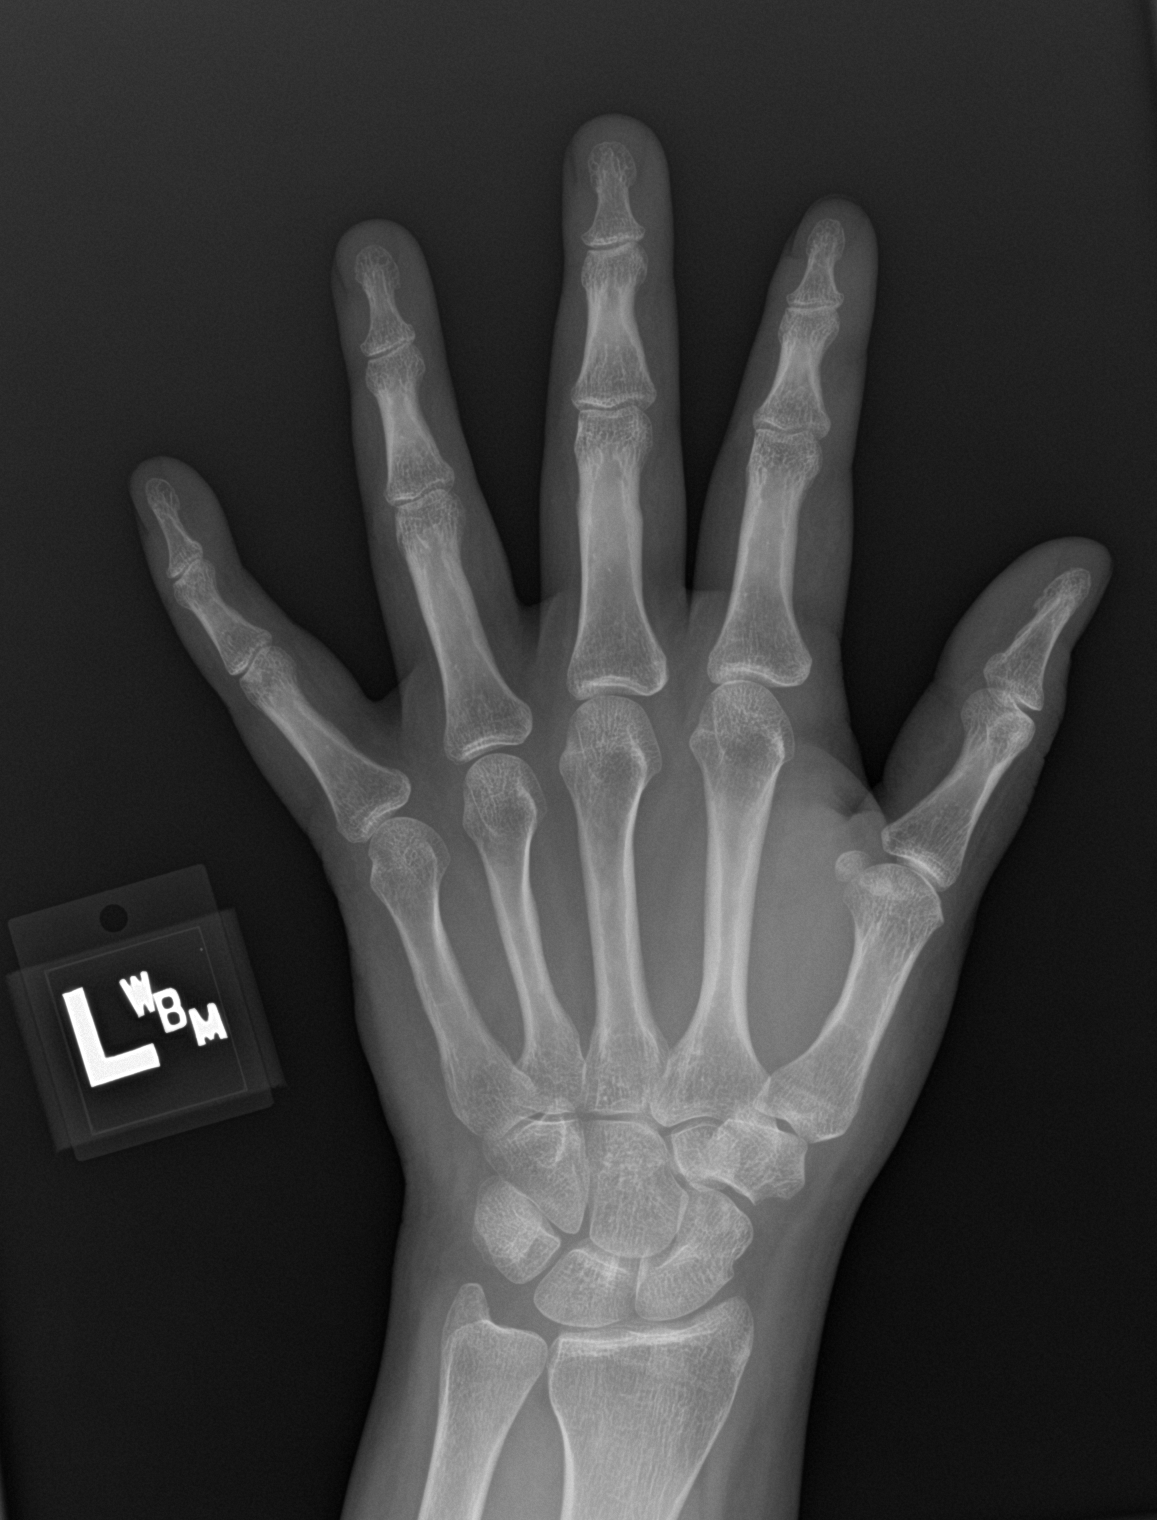

[hand obl]
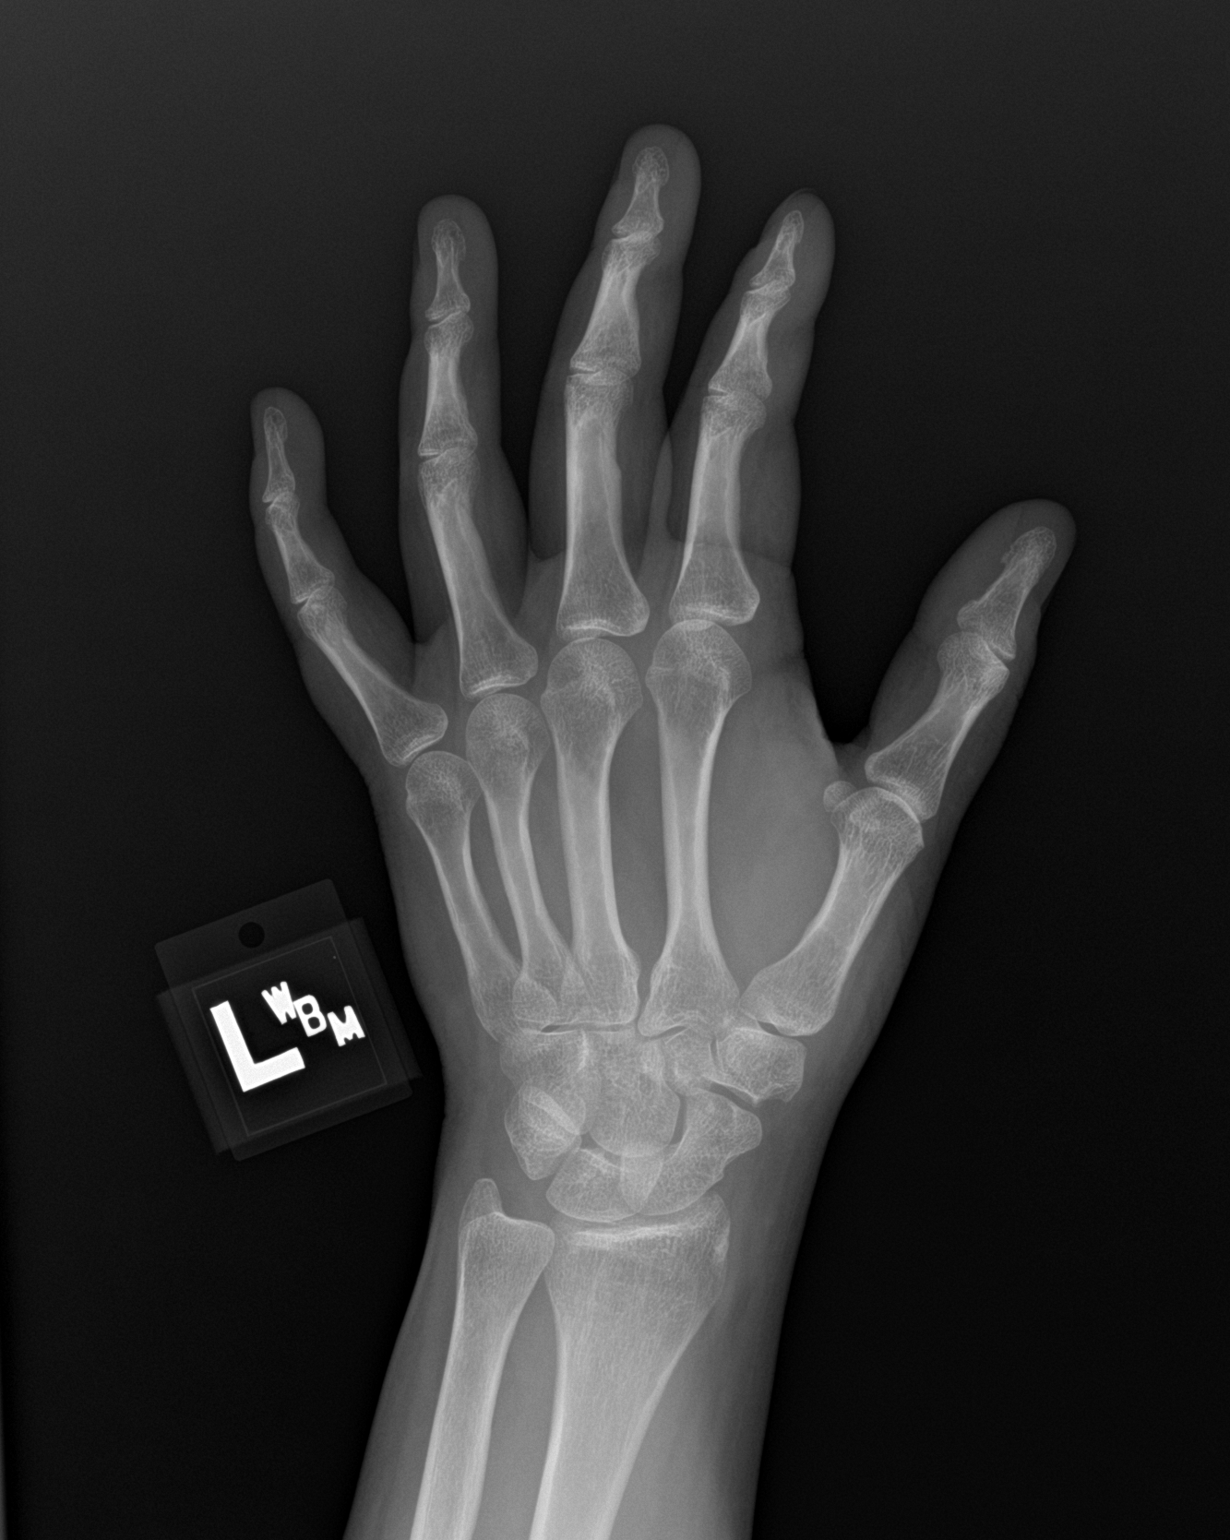

[hand lat]
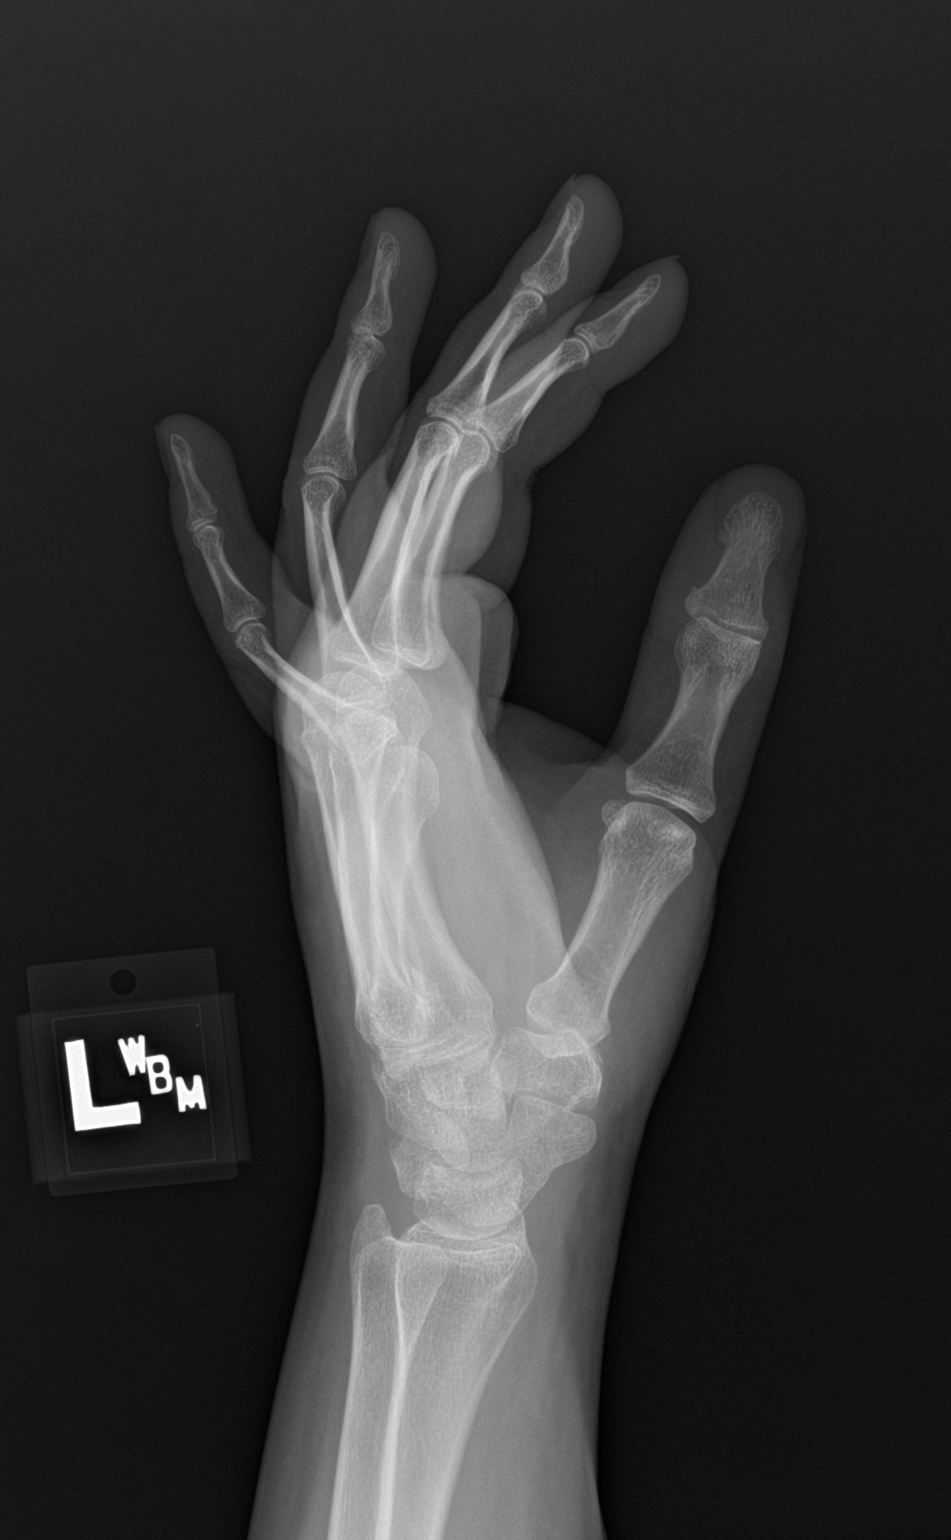

[3 of 3 positions shown; findings below may reference images not displayed]

FINDINGS: There is no evidence of fracture or dislocation. There is no
evidence of arthropathy or other focal bone abnormality. Soft
tissues are unremarkable.
IMPRESSION: No acute abnormality noted.

## 2023-09-26 DIAGNOSIS — K5100 Ulcerative (chronic) pancolitis without complications: Secondary | ICD-10-CM | POA: Diagnosis not present

## 2023-11-21 DIAGNOSIS — K5190 Ulcerative colitis, unspecified, without complications: Secondary | ICD-10-CM | POA: Diagnosis not present

## 2023-11-21 DIAGNOSIS — K219 Gastro-esophageal reflux disease without esophagitis: Secondary | ICD-10-CM | POA: Diagnosis not present

## 2024-01-02 DIAGNOSIS — K5289 Other specified noninfective gastroenteritis and colitis: Secondary | ICD-10-CM | POA: Diagnosis not present

## 2024-01-02 DIAGNOSIS — R197 Diarrhea, unspecified: Secondary | ICD-10-CM | POA: Diagnosis not present

## 2024-01-02 DIAGNOSIS — R1030 Lower abdominal pain, unspecified: Secondary | ICD-10-CM | POA: Diagnosis not present

## 2024-01-02 DIAGNOSIS — K625 Hemorrhage of anus and rectum: Secondary | ICD-10-CM | POA: Diagnosis not present

## 2024-01-02 LAB — HM COLONOSCOPY

## 2024-01-21 ENCOUNTER — Encounter: Payer: Self-pay | Admitting: Family Medicine

## 2024-01-21 ENCOUNTER — Ambulatory Visit: Admitting: Family Medicine

## 2024-01-21 ENCOUNTER — Ambulatory Visit: Payer: Self-pay

## 2024-01-21 VITALS — BP 122/70 | HR 95 | Ht 62.0 in | Wt 185.5 lb

## 2024-01-21 DIAGNOSIS — J0110 Acute frontal sinusitis, unspecified: Secondary | ICD-10-CM | POA: Diagnosis not present

## 2024-01-21 MED ORDER — AZITHROMYCIN 250 MG PO TABS
ORAL_TABLET | ORAL | 0 refills | Status: AC
Start: 2024-01-21 — End: ?

## 2024-01-21 NOTE — Progress Notes (Signed)
 Subjective:    Patient ID: Gwendolyn Rice, female    DOB: 05-01-1991, 33 y.o.   MRN: [id number]  Gwendolyn Rice is a 33 y.o. female presenting on 01/21/2024 for Sinusitis and Sore Throat  Patient presents for a same day appointment.  PCP Helayne Lo, FNP   HPI  Discussed the use of AI scribe software for clinical note transcription with the patient, who gave verbal consent to proceed.  History of Present Illness   Gwendolyn Rice is a 33 year old female who presents with sore throat, dizziness, and sinus congestion.  She began experiencing a sore throat this afternoon, initially attributing it to weather changes, as she sometimes experiences sore throats with such changes. The sore throat worsened this morning upon waking, accompanied by dizziness and a sensation of feeling hot and cold. She also experienced a pounding headache.  Her ears have been itching for the past few days, and she wonders if this is related to her current symptoms. She has not noticed anyone else around her being sick, except possibly from work. Her daughter recently had an ear infection, and she inquires if it could be contagious.  She has taken Tylenol , which has helped reduce the severity of her headache. She reports mild sinus congestion and a minimal cough. She experiences some digestive symptoms, which she attributes to her pre-existing stomach problems.  She uses nasal spray daily and takes an allergy pill. She has a history of antibiotic allergies, including amoxicillin, cipro , and penicillin, but she is unsure about azithromycin  (Z-Pak). She has previously taken a Z-Pak in 2018 and 2019.  No significant cough, nausea, vomiting, diarrhea, or swollen glands.           01/21/2024    3:59 PM 06/06/2022   11:12 AM 06/06/2022   10:15 AM  Depression screen PHQ 2/9  Decreased Interest 1 0 0  Down, Depressed, Hopeless 0 0 0  PHQ - 2 Score 1 0 0  Altered sleeping 1 0   Tired, decreased energy  1 0   Change in appetite 0 0   Feeling bad or failure about yourself  0 0   Trouble concentrating 0 0   Moving slowly or fidgety/restless 0 0   Suicidal thoughts 0 0   PHQ-9 Score 3 0   Difficult doing work/chores Not difficult at all Not difficult at all        01/21/2024    3:59 PM 06/06/2022   11:12 AM 02/12/2021   11:32 AM  GAD 7 : Generalized Anxiety Score  Nervous, Anxious, on Edge 0 0 2  Control/stop worrying 0 0 2  Worry too much - different things 2 0 2  Trouble relaxing 0 0 2  Restless 0 0 0  Easily annoyed or irritable 0 0 0  Afraid - awful might happen 0 0 0  Total GAD 7 Score 2 0 8  Anxiety Difficulty Not difficult at all Not difficult at all     Social History   Tobacco Use   Smoking status: Never   Smokeless tobacco: Never  Vaping Use   Vaping status: Never Used  Substance Use Topics   Alcohol use: Yes    Alcohol/week: 0.0 standard drinks of alcohol    Comment: Rare   Drug use: Never    Review of Systems Per HPI unless specifically indicated above     Objective:     BP 122/70 (BP Location: Right Arm, Patient Position: Sitting, Cuff Size: Normal)   Pulse  95   Ht 5\' 2"  (1.575 m)   Wt 185 lb 8 oz (84.1 kg)   SpO2 94%   BMI 33.93 kg/m   Wt Readings from Last 3 Encounters:  01/21/24 185 lb 8 oz (84.1 kg)  10/07/22 172 lb (78 kg)  07/04/22 167 lb (75.8 kg)    Physical Exam Vitals and nursing note reviewed.  Constitutional:      General: She is not in acute distress.    Appearance: She is well-developed. She is not diaphoretic.     Comments: Well-appearing, comfortable, cooperative  HENT:     Head: Normocephalic and atraumatic.     Right Ear: There is impacted cerumen.     Left Ear: There is impacted cerumen.     Nose: Congestion present.     Mouth/Throat:     Mouth: Mucous membranes are moist.     Pharynx: Posterior oropharyngeal erythema present. No oropharyngeal exudate.  Eyes:     General:        Right eye: No discharge.         Left eye: No discharge.     Conjunctiva/sclera: Conjunctivae normal.  Neck:     Thyroid : No thyromegaly.  Cardiovascular:     Rate and Rhythm: Normal rate and regular rhythm.     Heart sounds: Normal heart sounds. No murmur heard. Pulmonary:     Effort: Pulmonary effort is normal. No respiratory distress.     Breath sounds: Normal breath sounds. No wheezing or rales.  Musculoskeletal:        General: Normal range of motion.     Cervical back: Normal range of motion and neck supple.  Lymphadenopathy:     Cervical: No cervical adenopathy.  Skin:    General: Skin is warm and dry.     Findings: No erythema or rash.  Neurological:     Mental Status: She is alert and oriented to person, place, and time.  Psychiatric:        Behavior: Behavior normal.     Comments: Well groomed, good eye contact, normal speech and thoughts     Results for orders placed or performed in visit on 01/08/24  HM COLONOSCOPY   Collection Time: 01/02/24  9:13 AM  Result Value Ref Range   HM Colonoscopy See Report (in chart) See Report (in chart), Patient Reported      Assessment & Plan:   Problem List Items Addressed This Visit   None Visit Diagnoses       Acute non-recurrent frontal sinusitis    -  Primary   Relevant Medications   azithromycin  (ZITHROMAX  Z-PAK) 250 MG tablet        Acute Sinusitis / Pharyngitis Acute pharyngitis with possible early sinusitis infection. Viral etiology possible. Lungs clear. Discussed viral infections' contagious nature and potential bacterial progression. She has sick contact daughter at home. Possible otitis media but cannot see past cerumen impaction today.  She has multiple antibiotic allergies.  - Prescribed azithromycin  (Z-Pak) - Continue daily nasal spray. - Use Mucinex Sinus for congestion relief. - Continue acetaminophen  for headache. - Advise Debrox carbamide peroxide drops for cerumen removal when improved. - Provided work absence note.         No orders of the defined types were placed in this encounter.   Meds ordered this encounter  Medications   azithromycin  (ZITHROMAX  Z-PAK) 250 MG tablet    Sig: Take 2 tabs (500mg  total) on Day 1. Take 1 tab (250mg ) daily for next 4  days.    Dispense:  6 tablet    Refill:  0    Follow up plan: Return if symptoms worsen or fail to improve.   Domingo Friend, DO [facility] 01/21/2024, 4:21 PM

## 2024-01-21 NOTE — Patient Instructions (Addendum)
 Thank you for coming to the office today.  It sounds most like a Viral Pharyngitis (Sore Throat) caused by a Virus - this will most likely run it's course in 5 to 10 days. Wash hands good to prevent spread of virus.  - For Sore throat, take Ibuprofen  400-600mg  every 6-8 hours (3 times a day) with food, and you may also take Tylenol  500-1000mg  per dose every 6-8 hours (3 times a day) between ibuprofen  doses as needed - Drink extra clear fluids (water, or G2 gatorade), try colder soft foods if needed otherwise regular diet - Drink warm herbal tea with honey to help reduce sore throat swelling - You can also try to cover for potential allergy symptoms that often make sore throat worse due to post-nasal drainage     - Try over the counter Nasal Saline spray (Simply Saline, Ocean Spray) as needed to reduce congestion     - Try Loratadine (Claritin) 10mg  daily for up to 4 weeks     - Flonase  steroid nasal spray (OTC) 2 sprays in each nostril daily for 4 weeks  To Cover for Sinus infection and possible ear / throat infection Start Azithromycin  Z pak (antibiotic) 2 tabs day 1, then 1 tab x 4 days, complete entire course even if improved  Also ears have wax, I recommend Debrox ear clearing drops.   Please schedule a Follow-up Appointment to: Return if symptoms worsen or fail to improve.  If you have any other questions or concerns, please feel free to call the office or send a message through MyChart. You may also schedule an earlier appointment if necessary.  Additionally, you may be receiving a survey about your experience at our office within a few days to 1 week by e-mail or mail. We value your feedback.  Domingo Friend, DO [facility], [place]

## 2024-01-21 NOTE — Telephone Encounter (Signed)
 FYI Only or Action Required?: FYI only for provider  Patient was last seen in primary care on 06/06/2022 by Carollynn Cirri, NP. Called Nurse Triage reporting Headache and Sore Throat. Symptoms began today. Interventions attempted: OTC medications: Tylenol  and Rest, hydration, or home remedies. Symptoms are: gradually worsening.  Triage Disposition: See Physician Within 24 Hours  Patient/caregiver understands and will follow disposition?:      Copied From CRM [id number]. Reason for Triage: Pt called in having Headache states head is pounding and throat hurts  ----- Message from [facility] P sent at 01/21/2024 11:38 AM EDT ----- Copied From CRM [id number]. Reason for Triage: Pt called in having Headache states head is pounding and throat hurts   Reason for Disposition  [1] MODERATE dizziness (e.g., interferes with normal activities) AND [2] has NOT been evaluated by doctor (or NP/PA) for this  (Exception: Dizziness caused by heat exposure, sudden standing, or poor fluid intake.)  Answer Assessment - Initial Assessment Questions 1. DESCRIPTION: "Describe your dizziness."     Occurred this AM while using the bathroom (hx of colitis), pt states her head was throbbing and her chest began to hurt (resolved). Pt feels less dizzy now but endorses some mild lightheadedness  2. LIGHTHEADED: "Do you feel lightheaded?" (e.g., somewhat faint, woozy, weak upon standing)     "I feel a little lightheaded because my head is pounding" 3. VERTIGO: "Do you feel like either you or the room is spinning or tilting?" (i.e. vertigo)     no 4. SEVERITY: "How bad is it?"  "Do you feel like you are going to faint?" "Can you stand and walk?"   - MILD: Feels slightly dizzy, but walking normally.   - MODERATE: Feels unsteady when walking, but not falling; interferes with normal activities (e.g., school, work).   - SEVERE: Unable to walk without falling, or requires assistance to walk without falling; feels like passing out  now.      Moderate episode this AM, mild lightheadedness today 5. ONSET:  "When did the dizziness begin?"     This AM 6. AGGRAVATING FACTORS: "Does anything make it worse?" (e.g., standing, change in head position)     Not aggravated by position changes -- only happened this AM 7. HEART RATE: "Can you tell me your heart rate?" "How many beats in 15 seconds?"  (Note: not all patients can do this)       Endorses racing heart with chest pain and episode of dizziness this AM 8. CAUSE: "What do you think is causing the dizziness?"     Not sure  9. RECURRENT SYMPTOM: "Have you had dizziness before?" If Yes, ask: "When was the last time?" "What happened that time?"     Yes, but it's a been while  10. OTHER SYMPTOMS: "Do you have any other symptoms?" (e.g., fever, chest pain, vomiting, diarrhea, bleeding)       6/10 H/A pounding pain (took Tylenol  20-30 before phone call, also took Tylenol  Cold & Head congestion which did not help), ears are itching, throat pain, denies white patches/pus, light headedness w/ episode of dizziness this AM, cough  Denies vomiting   Denies CP at this time. Denies SOB. Endorses nasal congestion.  Protocols used: Dizziness - Lightheadedness-A-AH

## 2024-01-22 ENCOUNTER — Encounter: Payer: Self-pay | Admitting: Internal Medicine

## 2024-01-26 ENCOUNTER — Ambulatory Visit: Admitting: Internal Medicine

## 2024-02-05 ENCOUNTER — Ambulatory Visit: Admitting: Internal Medicine

## 2024-02-05 ENCOUNTER — Encounter: Payer: Self-pay | Admitting: Internal Medicine

## 2024-02-05 VITALS — BP 104/66 | Ht 62.0 in | Wt 194.2 lb

## 2024-02-05 DIAGNOSIS — Z136 Encounter for screening for cardiovascular disorders: Secondary | ICD-10-CM | POA: Diagnosis not present

## 2024-02-05 DIAGNOSIS — R232 Flushing: Secondary | ICD-10-CM | POA: Diagnosis not present

## 2024-02-05 DIAGNOSIS — E538 Deficiency of other specified B group vitamins: Secondary | ICD-10-CM | POA: Diagnosis not present

## 2024-02-05 DIAGNOSIS — R4020 Unspecified coma: Secondary | ICD-10-CM

## 2024-02-05 DIAGNOSIS — R739 Hyperglycemia, unspecified: Secondary | ICD-10-CM

## 2024-02-05 DIAGNOSIS — E559 Vitamin D deficiency, unspecified: Secondary | ICD-10-CM | POA: Diagnosis not present

## 2024-02-05 NOTE — Progress Notes (Signed)
 Subjective:    Patient ID: Gwendolyn Rice, female    DOB: 1991/05/22, 33 y.o.   MRN: [id number]  HPI  Discussed the use of AI scribe software for clinical note transcription with the patient, who gave verbal consent to proceed.  Gwendolyn Rice is a 33 year old female who presents with episodes of feeling hot and fainting.  She has been experiencing episodes of feeling excessively hot followed by fainting spells for over a month. These episodes occur a couple of times a week and are sometimes triggered while driving, causing her to press the brake and wake up. She describes these episodes as brief and does not feel she completely loses consciousness. No dizziness, thirst, jitteriness, or shortness of breath.  She feels very tired throughout the day, which she manages by chewing gum to stay awake at work. Her work schedule is demanding, with shifts from 5 AM to 3 PM, and she wakes up at 3:30 AM. She sleeps between 9 PM and 10:30 PM, but her sleep is sometimes disrupted by snoring, which she has experienced for a long time. She confirms snoring at night but is unaware of any episodes of apnea.  She is concerned about weight gain despite eating more salads recently. She questions if her symptoms could be related to low red blood cells or menopause, and reports that she is still having regular periods. She has not had blood work done in a few years.       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     azithromycin  (ZITHROMAX  Z-PAK) 250 MG tablet Take 2 tabs (500mg  total) on Day 1. Take 1 tab (250mg ) daily for next 4 days. 6 tablet 0   Cetirizine  HCl  (ZYRTEC  PO) Take 1 tablet by mouth. (Patient not taking: Reported on 01/21/2024)     dicyclomine  (BENTYL ) 10 MG capsule TAKE 1 CAPSULE (10 MG TOTAL) BY MOUTH 4 TIMES A DAY BEFORE MEALS AND AT BEDTIME (Patient not taking: Reported on 01/21/2024) 90 capsule 2   fluticasone  (FLONASE ) 50 MCG/ACT nasal spray Place 2 sprays into both nostrils daily. 48 mL 1   omeprazole  (PRILOSEC) 20 MG capsule TAKE 1 CAPSULE BY MOUTH EVERY DAY 90 capsule 0   No current facility-administered medications for this visit.    Allergies  Allergen Reactions   Amoxicillin Swelling   Ciprofloxacin  Swelling   Penicillins Other (See Comments)    Unknown reaction. as patient had a PCN reaction causing immediate rash, facial/tongue/throat swelling, SOB or lightheadedness with hypotension: unknown Has patient had a PCN reaction causing severe rash involving mucus membranes or skin necrosis: unknown Has patient had a PCN reaction that required hospitalization: no Has patient had a PCN reaction occurring within the last 10 years: yes If all of the above answers are NO, then may proceed with Cephalosporin use.    Sulfa Antibiotics Swelling   Flagyl  [Metronidazole ] Nausea And Vomiting   Macrobid  [Nitrofurantoin  Monohyd Macro]     States she gets very sick    Family History  Problem Relation Age of Onset   Heart attack Maternal Grandmother    Cancer Paternal  Aunt    Colon cancer Neg Hx    Breast cancer Neg Hx     Social History   Socioeconomic History   Marital status: Significant Other    Spouse name: Quinton   Number of children: 0   Years of education: Not on file   Highest education level: Not on file  Occupational History   Occupation: assembly line    Comment: Soil scientist  Tobacco Use   Smoking status: Never   Smokeless tobacco: Never  Vaping Use   Vaping status: Never Used  Substance and Sexual Activity   Alcohol use: Yes    Alcohol/week: 0.0 standard drinks of alcohol    Comment: Rare    Drug use: Never   Sexual activity: Yes    Partners: Male    Birth control/protection: None  Other Topics Concern   Not on file  Social History Narrative   Lives with significant other   Social Drivers of Corporate investment banker Strain: Not on file  Food Insecurity: Not on file  Transportation Needs: Not on file  Physical Activity: Not on file  Stress: Not on file  Social Connections: Not on file  Intimate Partner Violence: Not on file     Constitutional: Denies fever, malaise, fatigue, headache or abrupt weight changes.  HEENT: Denies eye pain, eye redness, ear pain, ringing in the ears, wax buildup, runny nose, nasal congestion, bloody nose, or sore throat. Respiratory: Denies difficulty breathing, shortness of breath, cough or sputum production.   Cardiovascular: Denies chest pain, chest tightness, palpitations or swelling in the hands or feet.  Gastrointestinal: Denies abdominal pain, bloating, constipation, diarrhea or blood in the stool.  GU: Denies urgency, frequency, pain with urination, burning sensation, blood in urine, odor or discharge. Musculoskeletal: Denies decrease in range of motion, difficulty with gait, muscle pain or joint pain and swelling.  Skin: Denies redness, rashes, lesions or ulcercations.  Neurological: Pt reports intermittent loss of consciousness, hot flashes. Denies difficulty with memory, difficulty with speech or problems with balance and coordination.  Psych: Denies anxiety, depression, SI/HI.  No other specific complaints in a complete review of systems (except as listed in HPI above).  Objective:   Physical Exam BP 104/66 (BP Location: Left Arm, Patient Position: Sitting, Cuff Size: Normal)   Ht 5' 2 (1.575 m)   Wt 194 lb 3.2 oz (88.1 kg)   LMP 01/21/2024 (Approximate)   Breastfeeding No   BMI 35.52 kg/m    Wt Readings from Last 3 Encounters:  01/21/24 185 lb 8 oz (84.1 kg)  10/07/22 172 lb (78 kg)  07/04/22 167 lb (75.8 kg)     General: Appears her stated age, obese,  in NAD. Neck:  Neck supple, trachea midline. No masses, lumps or thyromegaly present.  Cardiovascular: Normal rate and rhythm.  Pulmonary/Chest: Normal effort and positive vesicular breath sounds. No respiratory distress. No wheezes, rales or ronchi noted.   Musculoskeletal:  No difficulty with gait.  Neurological: Alert and oriented. Coordination normal.    BMET    Component Value Date/Time   NA 139 05/28/2022 1558   NA 141 12/10/2017 1723   K 4.0 05/28/2022 1558   CL 104 05/28/2022 1558   CO2 28 05/28/2022 1558   GLUCOSE 86 05/28/2022 1558   BUN 12 05/28/2022 1558   BUN 11 12/10/2017 1723   CREATININE 0.68 05/28/2022 1558   CALCIUM 9.6 05/28/2022 1558   GFRNONAA >60 10/25/2021 0654   GFRNONAA 123 02/12/2021 1147  GFRAA 142 02/12/2021 1147    Lipid Panel     Component Value Date/Time   CHOL 146 05/28/2022 1558   TRIG 132 05/28/2022 1558   HDL 55 05/28/2022 1558   CHOLHDL 2.7 05/28/2022 1558   VLDL 9.0 12/28/2019 1620   LDLCALC 69 05/28/2022 1558    CBC    Component Value Date/Time   WBC 5.6 05/28/2022 1558   RBC 3.73 (L) 05/28/2022 1558   HGB 12.1 05/28/2022 1558   HCT 35.9 05/28/2022 1558   PLT 419 (H) 05/28/2022 1558   MCV 96.2 05/28/2022 1558   MCV 87.4 12/17/2017 1713   MCH 32.4 05/28/2022 1558   MCHC 33.7 05/28/2022 1558   RDW 13.9 05/28/2022 1558   LYMPHSABS 2.2 12/09/2018 1556   MONOABS 0.7 12/09/2018 1556   EOSABS 0.5 12/09/2018 1556   BASOSABS 0.1 12/09/2018 1556    Hgb A1C Lab Results  Component Value Date   HGBA1C 5.6 05/28/2022           Assessment & Plan:  Assessment and Plan    Hot flashes, loss of consciousness Experiences syncopal episodes with heat sensation and brief fainting. Differential includes sleep apnea, narcolepsy, thyroid  dysfunction, and blood sugar abnormalities. Reports fatigue and weight gain. Sleep apnea considered due to snoring and daytime fatigue. Discussed cost  concerns for sleep study. - Order full blood workup: CBC, liver and kidney function, cholesterol, thyroid  function, diabetes screening, Vit D and B12. - Advise diary of syncopal episodes: frequency and activities. - Discuss potential sleep study if blood work inconclusive, considering financial concerns.    Schedule an appt for your annual exam Helayne Lo, NP

## 2024-02-05 NOTE — Patient Instructions (Signed)
Syncope, Adult  Syncope is when you pass out or faint for a short time. It is caused by a sudden decrease in blood flow to the brain. This can happen for many reasons. It can sometimes happen when seeing blood, getting a shot (injection), or having pain or strong emotions. Most causes of fainting are not dangerous, but in some cases it can be a sign of a serious medical problem. If you faint, get help right away. Call your local emergency services (911 in the [place] Follow these instructions at home: Watch for any changes in your symptoms. Take these actions to stay safe and help with your symptoms: Knowing when you may be about to faint Signs that you may be about to faint include: Feeling dizzy or light-headed. It may feel like the room is spinning. Feeling weak. Feeling like you may vomit (nauseous). Seeing spots or seeing all white or all black. Having cold, clammy skin. Feeling warm and sweaty. Hearing ringing in the ears. If you start to feel like you might faint, sit or lie down right away. If sitting, lower your head down between your legs. If lying down, raise (elevate) your feet above the level of your heart. Breathe deeply and steadily. Wait until all of the symptoms are gone. Have someone stay with you until you feel better. Medicines Take over-the-counter and prescription medicines only as told by your doctor. If you are taking blood pressure or heart medicine, sit up and stand up slowly. Spend a few minutes getting ready to sit and then stand. This can help you feel less dizzy. Lifestyle Do not drive, use machinery, or play sports until your doctor says it is okay. Do not drink alcohol. Do not smoke or use any products that contain nicotine or tobacco. If you need help quitting, ask your doctor. Avoid hot tubs and saunas. General instructions Talk with your doctor about your symptoms. You may need to have testing to help find the cause. Drink enough fluid to keep your pee  (urine) pale yellow. Avoid standing for a long time. If you must stand for a long time, do movements such as: Moving your legs. Crossing your legs. Flexing and stretching your leg muscles. Squatting. Keep all follow-up visits. Contact a doctor if: You have episodes of near fainting. Get help right away if: You pass out or faint. You hit your head or are injured after fainting. You have any of these symptoms: Fast or uneven heartbeats (palpitations). Pain in your chest, belly, or back. Shortness of breath. You have jerky movements that you cannot control (seizure). You have a very bad headache. You are confused. You have problems with how you see (vision). You are very weak. You have trouble walking. You are bleeding from your mouth or your butt (rectum). You have black or tarry poop (stool). These symptoms may be an emergency. Get help right away. Call your local emergency services (911 in the [place] Do not wait to see if the symptoms will go away. Do not drive yourself to the hospital. Summary Syncope is when you pass out or faint for a short time. It is caused by a sudden decrease in blood flow to the brain. Signs that you may be about to faint include feeling dizzy or light-headed, feeling like you may vomit, seeing all white or all black, or having cold, clammy skin. If you start to feel like you might faint, sit or lie down right away. Lower your head if sitting, or raise (elevate)   your feet if lying down. Breathe deeply and steadily. Wait until all of the symptoms are gone. This information is not intended to replace advice given to you by your health care provider. Make sure you discuss any questions you have with your health care provider. Document Revised: 12/14/2020 Document Reviewed: 12/14/2020 Elsevier Patient Education  2024 [employer].

## 2024-02-06 ENCOUNTER — Ambulatory Visit: Payer: Self-pay | Admitting: Internal Medicine

## 2024-02-06 LAB — CBC
HCT: 39.3 % (ref 35.0–45.0)
Hemoglobin: 13.2 g/dL (ref 11.7–15.5)
MCH: 32.3 pg (ref 27.0–33.0)
MCHC: 33.6 g/dL (ref 32.0–36.0)
MCV: 96.1 fL (ref 80.0–100.0)
MPV: 9.8 fL (ref 7.5–12.5)
Platelets: 365 10*3/uL (ref 140–400)
RBC: 4.09 10*6/uL (ref 3.80–5.10)
RDW: 12.9 % (ref 11.0–15.0)
WBC: 6.6 10*3/uL (ref 3.8–10.8)

## 2024-02-06 LAB — COMPREHENSIVE METABOLIC PANEL WITH GFR
AG Ratio: 1.8 (calc) (ref 1.0–2.5)
ALT: 16 U/L (ref 6–29)
AST: 21 U/L (ref 10–30)
Albumin: 4.8 g/dL (ref 3.6–5.1)
Alkaline phosphatase (APISO): 49 U/L (ref 31–125)
BUN: 11 mg/dL (ref 7–25)
CO2: 24 mmol/L (ref 20–32)
Calcium: 9.8 mg/dL (ref 8.6–10.2)
Chloride: 103 mmol/L (ref 98–110)
Creat: 0.55 mg/dL (ref 0.50–0.97)
Globulin: 2.6 g/dL (ref 1.9–3.7)
Glucose, Bld: 95 mg/dL (ref 65–139)
Potassium: 4 mmol/L (ref 3.5–5.3)
Sodium: 137 mmol/L (ref 135–146)
Total Bilirubin: 0.5 mg/dL (ref 0.2–1.2)
Total Protein: 7.4 g/dL (ref 6.1–8.1)
eGFR: 125 mL/min/{1.73_m2} (ref >=60)

## 2024-02-06 LAB — VITAMIN B12: Vitamin B-12: 420 pg/mL (ref 200–1100)

## 2024-02-06 LAB — HEMOGLOBIN A1C
Hgb A1c MFr Bld: 5.6 % (ref <5.7)
Mean Plasma Glucose: 114 mg/dL
eAG (mmol/L): 6.3 mmol/L

## 2024-02-06 LAB — LIPID PANEL
Cholesterol: 169 mg/dL (ref <200)
HDL: 60 mg/dL (ref >=50)
LDL Cholesterol (Calc): 95 mg/dL
Non-HDL Cholesterol (Calc): 109 mg/dL (ref <130)
Total CHOL/HDL Ratio: 2.8 (calc) (ref <5.0)
Triglycerides: 59 mg/dL (ref <150)

## 2024-02-06 LAB — VITAMIN D 25 HYDROXY (VIT D DEFICIENCY, FRACTURES): Vit D, 25-Hydroxy: 46 ng/mL (ref 30–100)

## 2024-02-06 LAB — TSH: TSH: 1.66 m[IU]/L

## 2024-04-16 ENCOUNTER — Ambulatory Visit: Payer: Self-pay

## 2024-04-16 ENCOUNTER — Encounter: Payer: Self-pay | Admitting: Internal Medicine

## 2024-04-16 ENCOUNTER — Telehealth: Admitting: Internal Medicine

## 2024-04-16 DIAGNOSIS — J3089 Other allergic rhinitis: Secondary | ICD-10-CM

## 2024-04-16 DIAGNOSIS — J309 Allergic rhinitis, unspecified: Secondary | ICD-10-CM | POA: Diagnosis not present

## 2024-04-16 DIAGNOSIS — J329 Chronic sinusitis, unspecified: Secondary | ICD-10-CM | POA: Diagnosis not present

## 2024-04-16 DIAGNOSIS — B9789 Other viral agents as the cause of diseases classified elsewhere: Secondary | ICD-10-CM

## 2024-04-16 NOTE — Patient Instructions (Signed)
 Allergic Rhinitis, Adult  Allergic rhinitis is a reaction to allergens. Allergens are things that can cause an allergic reaction. This condition affects the lining inside the nose (mucous membrane). There are two types of allergic rhinitis: Seasonal. This type is also called hay fever. It happens only during some times of the year. Perennial. This type can happen at any time of the year. This condition cannot be spread from person to person (is not contagious). It can be mild, bad, or very bad. It can develop at any age and may be outgrown. What are the causes? Pollen from grasses, trees, and weeds. Other causes can be: Dust mites. Smoke. Mold. Car fumes. The pee (urine), spit, or dander of pets. Dander is dead skin cells from a pet. What increases the risk? You are more likely to develop this condition if: You have allergies in your family. You have problems like allergies in your family. You may have: Swelling of parts of your eyes and eyelids. Asthma. This affects how you breathe. Long-term redness and swelling on your skin. Food allergies. What are the signs or symptoms? The main symptom of this condition is a runny or stuffy nose (nasal congestion). Other symptoms may include: Sneezing or coughing. Itching and tearing of your eyes. Mucus that drips down the back of your throat (postnasal drip). This may cause a sore throat. Trouble sleeping. Feeling tired. Headache. How is this treated? There is no cure for this condition. You should avoid things that you are allergic to. Treatment can help to relieve symptoms. This may include: Medicines that block allergy symptoms, such as anti-inflammatories or antihistamines. These may be given as a shot, nasal spray, or pill. Avoiding things you are allergic to. Medicines that give you some of what you are allergic to over time. This is called immunotherapy. It is done if other treatments do not help. You may get: Shots. Medicine under  your tongue. Stronger medicines, if other treatments do not help. Follow these instructions at home: Avoiding allergens Find out what things you are allergic to and avoid them. To do this, try these things: If you get allergies any time of year: Replace carpet with wood, tile, or vinyl flooring. Carpet can trap pet dander and dust. Do not smoke. Do not allow smoking in your home. Change your heating and air conditioning filters at least once a month. If you get allergies only some times of the year: Keep windows closed when you can. Plan things to do outside when pollen counts are lowest. Check pollen counts before you plan things to do outside. When you come indoors, change your clothes and shower before you sit on furniture or bedding. If you are allergic to a pet: Keep the pet out of your bedroom. Vacuum, sweep, and dust often. General instructions Take over-the-counter and prescription medicines only as told by your doctor. Drink enough fluid to keep your pee pale yellow. Where to find more information American Academy of Allergy, Asthma & Immunology: aaaai.org Contact a doctor if: You have a fever. You get a cough that does not go away. You make high-pitched whistling sounds when you breathe, most often when you breathe out (wheeze). Your symptoms slow you down. Your symptoms stop you from doing your normal things each day. Get help right away if: You are short of breath. This symptom may be an emergency. Get help right away. Call 911. Do not wait to see if the symptom will go away. Do not drive yourself to the  hospital. This information is not intended to replace advice given to you by your health care provider. Make sure you discuss any questions you have with your health care provider. Document Revised: 04/15/2022 Document Reviewed: 04/15/2022 Elsevier Patient Education  2024 [employer].

## 2024-04-16 NOTE — Progress Notes (Signed)
 Virtual Visit via Video Note  I connected with [name] on 04/16/24 at  2:00 PM EDT by a video enabled telemedicine application and verified that I am speaking with the correct person using two identifiers.  Location: Patient: Home Provider: Office  Person's participating in this video call: Angeline Laura, NP-C and Dentist   I discussed the limitations of evaluation and management by telemedicine and the availability of in person appointments. The patient expressed understanding and agreed to proceed.  History of Present Illness:   Discussed the use of AI scribe software for clinical note transcription with the patient, who gave verbal consent to proceed.  Gwendolyn Rice is a 33 year old female who presents with allergy symptoms including headache, ear fullness, runny nose, nasal congestion and sore throat.  She has been experiencing throat for two days, which she attributes to weather changes.  This has improved however the symptoms progressed to nasal congestion and ear fullness.   Additionally she has a cough and notes that mucus is expelled when she coughs. No body aches, chills or current fever, although she mentions frequent headaches, making it difficult to discern if they are related to her current symptoms.  She has been taking over-the-counter sinus and allergy medications, including a [employer] brand allergy medicine and a nasal spray, possibly Flonase , which she uses once daily.  She has not had sick contacts that she is aware of.  She does not think that she has COVID.     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     azithromycin   (ZITHROMAX  Z-PAK) 250 MG tablet Take 2 tabs (500mg  total) on Day 1. Take 1 tab (250mg ) daily for next 4 days. 6 tablet 0   fluticasone  (FLONASE ) 50 MCG/ACT nasal spray Place 2 sprays into both nostrils daily. 48 mL 1   hyoscyamine (LEVSIN) 0.125 MG tablet Take 0.125 mg by mouth 2 (two) times daily as needed for cramping.     omeprazole  (PRILOSEC) 20 MG capsule TAKE 1 CAPSULE BY MOUTH EVERY DAY 90 capsule 0   No current facility-administered medications for this visit.    Allergies  Allergen Reactions   Amoxicillin Swelling   Ciprofloxacin  Swelling   Penicillins Other (See Comments)    Unknown reaction. as patient had a PCN reaction causing immediate rash, facial/tongue/throat swelling, SOB or lightheadedness with hypotension: unknown Has patient had a PCN reaction causing severe rash involving mucus membranes or skin necrosis: unknown Has patient had a PCN reaction that required hospitalization: no Has patient had a PCN reaction occurring within the last 10 years: yes If all of the above answers are NO, then may proceed with Cephalosporin use.    Sulfa Antibiotics Swelling   Flagyl  [Metronidazole ] Nausea And Vomiting   Macrobid  [Nitrofurantoin  Monohyd Macro]     States she gets very sick    Family History  Problem Relation Age of Onset   Heart attack Maternal Grandmother    Cancer Paternal Aunt    Colon cancer Neg Hx    Breast cancer Neg Hx     Social History   Socioeconomic History   Marital status: Significant Other  Spouse name: Quinton   Number of children: 0   Years of education: Not on file   Highest education level: Not on file  Occupational History   Occupation: assembly line    Comment: Soil scientist  Tobacco Use   Smoking status: Never   Smokeless tobacco: Never  Vaping Use   Vaping status: Never Used  Substance and Sexual Activity   Alcohol use: Yes    Alcohol/week: 0.0 standard drinks of alcohol    Comment: Rare   Drug use: Never   Sexual  activity: Yes    Partners: Male    Birth control/protection: None  Other Topics Concern   Not on file  Social History Narrative   Lives with significant other   Social Drivers of Corporate investment banker Strain: Not on file  Food Insecurity: Not on file  Transportation Needs: Not on file  Physical Activity: Not on file  Stress: Not on file  Social Connections: Not on file  Intimate Partner Violence: Not on file     Constitutional: Pt reports frequent headaches. Denies fever, malaise, fatigue, or abrupt weight changes.  HEENT: Pt reports ear fullness, runny nose, nasal congestion, sore throat. Denies eye pain, eye redness, ear pain, ringing in the ears, wax buildup, bloody nose. Respiratory: Pt reports cough. Denies difficulty breathing, shortness of breath.   Cardiovascular: Denies chest pain, chest tightness, palpitations or swelling in the hands or feet.  Gastrointestinal: Denies abdominal pain, bloating, constipation, diarrhea or blood in the stool.  GU: Denies urgency, frequency, pain with urination, burning sensation, blood in urine, odor or discharge. Musculoskeletal: Denies decrease in range of motion, difficulty with gait, muscle pain or joint pain and swelling.  Skin: Denies redness, rashes, lesions or ulcercations.  Neurological: Denies dizziness, difficulty with memory, difficulty with speech or problems with balance and coordination.  Psych: Denies anxiety, depression, SI/HI.  No other specific complaints in a complete review of systems (except as listed in HPI above).  Observations/Objective:  Wt Readings from Last 3 Encounters:  02/05/24 194 lb 3.2 oz (88.1 kg)  01/21/24 185 lb 8 oz (84.1 kg)  10/07/22 172 lb (78 kg)    General: Appears her stated age, in NAD. HEENT: Head: normal shape and size, sinus pressure reported in the maxillary region;  Nose: Congestion noted; Throat/Mouth: Hoarseness noted.  Pulmonary/Chest: Normal effort. No respiratory distress.   Neurological: Alert and oriented.   BMET    Component Value Date/Time   NA 137 02/05/2024 1605   NA 141 12/10/2017 1723   K 4.0 02/05/2024 1605   CL 103 02/05/2024 1605   CO2 24 02/05/2024 1605   GLUCOSE 95 02/05/2024 1605   BUN 11 02/05/2024 1605   BUN 11 12/10/2017 1723   CREATININE 0.55 02/05/2024 1605   CALCIUM 9.8 02/05/2024 1605   GFRNONAA >60 10/25/2021 0654   GFRNONAA 123 02/12/2021 1147   GFRAA 142 02/12/2021 1147    Lipid Panel     Component Value Date/Time   CHOL 169 02/05/2024 1605   TRIG 59 02/05/2024 1605   HDL 60 02/05/2024 1605   CHOLHDL 2.8 02/05/2024 1605   VLDL 9.0 12/28/2019 1620   LDLCALC 95 02/05/2024 1605    CBC    Component Value Date/Time   WBC 6.6 02/05/2024 1605   RBC 4.09 02/05/2024 1605   HGB 13.2 02/05/2024 1605   HCT 39.3 02/05/2024 1605   PLT 365 02/05/2024 1605   MCV 96.1 02/05/2024 1605   MCV 87.4 12/17/2017 1713  MCH 32.3 02/05/2024 1605   MCHC 33.6 02/05/2024 1605   RDW 12.9 02/05/2024 1605   LYMPHSABS 2.2 12/09/2018 1556   MONOABS 0.7 12/09/2018 1556   EOSABS 0.5 12/09/2018 1556   BASOSABS 0.1 12/09/2018 1556    Hgb A1C Lab Results  Component Value Date   HGBA1C 5.6 02/05/2024       Assessment and Plan: Assessment and Plan    Allergic rhinitis, viral sinusitis Acute allergic rhinitis with nasal congestion, rhinorrhea, and mild headache. Symptoms consistent with previous episodes, no COVID-19 exposure. - Increase Flonase  nasal spray to twice daily for three days. - Increase allergy medication to twice daily for three days. - Re-evaluate if symptoms worsen or do not improve by Tuesday. - Consider in-person evaluation if symptoms persist or worsen. - Antibiotics not indicated at this time given duration of symptoms  Schedule an appointment for your annual exam  Follow Up Instructions:    I discussed the assessment and treatment plan with the patient. The patient was provided an opportunity to ask questions  and all were answered. The patient agreed with the plan and demonstrated an understanding of the instructions.   The patient was advised to call back or seek an in-person evaluation if the symptoms worsen or if the condition fails to improve as anticipated.   Angeline Laura, NP

## 2024-04-16 NOTE — Telephone Encounter (Signed)
 Will discuss at upcoming appointment

## 2024-04-16 NOTE — Telephone Encounter (Signed)
 FYI Only or Action Required?: FYI only for provider.  Patient was last seen in primary care on 02/05/2024 by Antonette Angeline ORN, NP.  Called Nurse Triage reporting Sinus Problem.  Symptoms began several days ago.  Interventions attempted: OTC medications: cough/cold medication.  Symptoms are: unchanged.  Triage Disposition: See Physician Within 24 Hours  Patient/caregiver understands and will follow disposition?: Yes         Summary: congestion / rx req   The patient shares that they have experienced sinus discomfort for roughly 2 days. The patient shares that they have been previously prescribed azithromycin  (ZITHROMAX  Z-PAK) 250 MG tablet [[id number]] to help with congestion and slight sore throat and cough.  The patient has tried over the counter medication with little to no effect. Please contact further when possible       Reason for Disposition  Earache  Answer Assessment - Initial Assessment Questions 1. LOCATION: Where does it hurt?      Around nose  2. ONSET: When did the sinus pain start?  (e.g., hours, days)      2-3 days ago  3. SEVERITY: How bad is the pain?   (Scale 0-10; or none, mild, moderate or severe)     Mild to moderate 4. RECURRENT SYMPTOM: Have you ever had sinus problems before? If Yes, ask: When was the last time? and What happened that time?      Yes, has been treated with Zithromax  for sinus infections in the past  5. NASAL CONGESTION: Is the nose blocked? If Yes, ask: Can you open it or must you breathe through your mouth?     Yes 6. NASAL DISCHARGE: Do you have discharge from your nose? If so ask, What color?     No 7. FEVER: Do you have a fever? If Yes, ask: What is it, how was it measured, and when did it start?      No 8. OTHER SYMPTOMS: Do you have any other symptoms? (e.g., sore throat, cough, earache, difficulty breathing)     Ears are stuffy  Protocols used: Sinus Pain or Congestion-A-AH

## 2024-05-28 DIAGNOSIS — K5100 Ulcerative (chronic) pancolitis without complications: Secondary | ICD-10-CM | POA: Diagnosis not present

## 2024-06-12 DIAGNOSIS — K5100 Ulcerative (chronic) pancolitis without complications: Secondary | ICD-10-CM | POA: Diagnosis not present

## 2024-07-10 DIAGNOSIS — K5100 Ulcerative (chronic) pancolitis without complications: Secondary | ICD-10-CM | POA: Diagnosis not present
